# Patient Record
Sex: Female | Born: 1945 | Hispanic: No | Marital: Married | Smoking: Never smoker
Health system: Southern US, Community
[De-identification: ages and names within clinical notes are randomized; demographics above are authoritative.]

## PROBLEM LIST (undated history)

## (undated) DIAGNOSIS — I10 Essential (primary) hypertension: Secondary | ICD-10-CM

## (undated) DIAGNOSIS — E785 Hyperlipidemia, unspecified: Secondary | ICD-10-CM

## (undated) DIAGNOSIS — E119 Type 2 diabetes mellitus without complications: Secondary | ICD-10-CM

## (undated) DIAGNOSIS — I2510 Atherosclerotic heart disease of native coronary artery without angina pectoris: Secondary | ICD-10-CM

## (undated) HISTORY — DX: Hyperlipidemia, unspecified: E78.5

## (undated) HISTORY — DX: Type 2 diabetes mellitus without complications: E11.9

## (undated) HISTORY — PX: CORONARY ANGIOPLASTY: SHX604

## (undated) HISTORY — PX: KNEE SURGERY: SHX244

## (undated) HISTORY — DX: Atherosclerotic heart disease of native coronary artery without angina pectoris: I25.10

## (undated) HISTORY — PX: HEMORRHOID SURGERY: SHX153

---

## 1998-12-05 ENCOUNTER — Ambulatory Visit (HOSPITAL_BASED_OUTPATIENT_CLINIC_OR_DEPARTMENT_OTHER): Admission: RE | Admit: 1998-12-05 | Discharge: 1998-12-05 | Payer: Self-pay | Admitting: General Surgery

## 2008-10-29 ENCOUNTER — Emergency Department (HOSPITAL_COMMUNITY): Admission: EM | Admit: 2008-10-29 | Discharge: 2008-10-29 | Payer: Self-pay | Admitting: Family Medicine

## 2009-03-14 ENCOUNTER — Ambulatory Visit: Payer: Self-pay | Admitting: Internal Medicine

## 2009-03-14 DIAGNOSIS — I10 Essential (primary) hypertension: Secondary | ICD-10-CM | POA: Insufficient documentation

## 2009-03-14 DIAGNOSIS — E785 Hyperlipidemia, unspecified: Secondary | ICD-10-CM

## 2009-03-14 DIAGNOSIS — M5430 Sciatica, unspecified side: Secondary | ICD-10-CM | POA: Insufficient documentation

## 2009-03-24 LAB — CONVERTED CEMR LAB
CO2: 20 meq/L (ref 19–32)
Chloride: 107 meq/L (ref 96–112)
HDL: 47 mg/dL (ref >39)
LDL Cholesterol: 276 mg/dL — ABNORMAL HIGH (ref 0–99)
Sodium: 142 meq/L (ref 135–145)
Total CHOL/HDL Ratio: 7.6
VLDL: 32 mg/dL (ref 0–40)

## 2014-07-25 ENCOUNTER — Encounter: Payer: Self-pay | Admitting: Family Medicine

## 2017-04-23 DIAGNOSIS — E669 Obesity, unspecified: Secondary | ICD-10-CM | POA: Diagnosis not present

## 2017-04-23 DIAGNOSIS — I208 Other forms of angina pectoris: Secondary | ICD-10-CM | POA: Diagnosis not present

## 2017-04-23 DIAGNOSIS — I10 Essential (primary) hypertension: Secondary | ICD-10-CM | POA: Diagnosis not present

## 2017-04-23 DIAGNOSIS — I2510 Atherosclerotic heart disease of native coronary artery without angina pectoris: Secondary | ICD-10-CM | POA: Diagnosis not present

## 2017-10-29 DIAGNOSIS — I208 Other forms of angina pectoris: Secondary | ICD-10-CM | POA: Diagnosis not present

## 2017-10-29 DIAGNOSIS — I2510 Atherosclerotic heart disease of native coronary artery without angina pectoris: Secondary | ICD-10-CM | POA: Diagnosis not present

## 2017-10-29 DIAGNOSIS — E669 Obesity, unspecified: Secondary | ICD-10-CM | POA: Diagnosis not present

## 2017-10-29 DIAGNOSIS — I25119 Atherosclerotic heart disease of native coronary artery with unspecified angina pectoris: Secondary | ICD-10-CM | POA: Diagnosis not present

## 2017-10-29 DIAGNOSIS — I10 Essential (primary) hypertension: Secondary | ICD-10-CM | POA: Diagnosis not present

## 2018-03-15 ENCOUNTER — Encounter: Payer: Self-pay | Admitting: Family Medicine

## 2018-03-15 DIAGNOSIS — E785 Hyperlipidemia, unspecified: Secondary | ICD-10-CM | POA: Diagnosis not present

## 2018-05-06 DIAGNOSIS — I10 Essential (primary) hypertension: Secondary | ICD-10-CM | POA: Diagnosis not present

## 2018-05-06 DIAGNOSIS — I208 Other forms of angina pectoris: Secondary | ICD-10-CM | POA: Diagnosis not present

## 2018-05-06 DIAGNOSIS — I2510 Atherosclerotic heart disease of native coronary artery without angina pectoris: Secondary | ICD-10-CM | POA: Diagnosis not present

## 2018-11-21 ENCOUNTER — Ambulatory Visit (HOSPITAL_COMMUNITY)
Admission: EM | Admit: 2018-11-21 | Discharge: 2018-11-21 | Disposition: A | Discharge status: Home / Self Care | Payer: Medicare Other | Attending: Family Medicine | Admitting: Family Medicine

## 2018-11-21 ENCOUNTER — Encounter: Payer: Self-pay | Admitting: Emergency Medicine

## 2018-11-21 DIAGNOSIS — I10 Essential (primary) hypertension: Secondary | ICD-10-CM

## 2018-11-21 DIAGNOSIS — Z760 Encounter for issue of repeat prescription: Secondary | ICD-10-CM | POA: Insufficient documentation

## 2018-11-21 HISTORY — DX: Essential (primary) hypertension: I10

## 2018-11-21 MED ORDER — AMLODIPINE BESYLATE 10 MG PO TABS: 10.0000 mg | ORAL_TABLET | Freq: Every day | ORAL | 0 refills | Status: DC

## 2018-11-21 MED ORDER — ISOSORBIDE MONONITRATE ER 60 MG PO TB24: 60.0000 mg | ORAL_TABLET | Freq: Every day | ORAL | 1 refills | Status: DC

## 2018-11-21 MED ORDER — METOPROLOL TARTRATE 50 MG PO TABS: 50.0000 mg | ORAL_TABLET | Freq: Two times a day (BID) | ORAL | 1 refills | Status: DC

## 2018-11-21 MED ORDER — METFORMIN HCL 500 MG PO TABS: 500.0000 mg | ORAL_TABLET | Freq: Every day | ORAL | 1 refills | Status: DC

## 2018-11-21 MED ORDER — CLOPIDOGREL BISULFATE 75 MG PO TABS: 75.0000 mg | ORAL_TABLET | Freq: Every day | ORAL | 1 refills | Status: DC

## 2018-11-21 MED ORDER — METOPROLOL TARTRATE 100 MG PO TABS: 100.0000 mg | ORAL_TABLET | Freq: Two times a day (BID) | ORAL | 1 refills | Status: DC

## 2018-11-21 MED ORDER — LISINOPRIL 40 MG PO TABS: 40.0000 mg | ORAL_TABLET | Freq: Every day | ORAL | 1 refills | Status: DC

## 2018-11-21 MED ORDER — ATORVASTATIN CALCIUM 80 MG PO TABS: 80.0000 mg | ORAL_TABLET | Freq: Every day | ORAL | 1 refills | Status: DC

## 2018-11-21 NOTE — ED Provider Notes (Signed)
[facility]    CSN: [id number] Arrival date & time: 11/21/18  1055     History   Chief Complaint Chief Complaint  Patient presents with  . Medication Refill    HPI Erin Estes is a 72 y.o. female.   HPI  Patient recently moved here from [facility].  She states she is out of all of her medications.  See medication list, all of them are refilled today.  She is compliant with medications.  She feels better when taking the medicines.  She needs a referral to her PCP.  She states she is been trying to find one but has not had any luck.  She has well-controlled hypertension.  Well-controlled diabetes.  History of heart disease.  Today she states she is feeling well.  Past Medical History:  Diagnosis Date  . Hypertension     Patient Active Problem List   Diagnosis Date Noted  . HYPERLIPIDEMIA 03/14/2009  . HYPERTENSION 03/14/2009  . 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 No pertinent surgical history.  OB History   No obstetric history on file.      Home Medications    Prior to Admission medications   Medication Sig Start Date End Date Taking? Authorizing Provider  amLODipine (NORVASC) 10 MG tablet Take 1 tablet (10 mg total) by mouth daily. 11/21/18   Eustace Moore, MD  atorvastatin (LIPITOR) 80 MG tablet Take 1 tablet (80 mg total) by mouth daily. 11/21/18   Eustace Moore, MD  clopidogrel (PLAVIX) 75 MG tablet Take 1 tablet (75 mg total) by mouth daily. 11/21/18   Eustace Moore, MD  isosorbide mononitrate (IMDUR) 60 MG 24 hr tablet Take 1 tablet (60 mg total) by mouth daily. 11/21/18   Eustace Moore, MD  lisinopril (PRINIVIL,ZESTRIL) 40 MG tablet Take 1 tablet (40 mg total) by mouth daily. 11/21/18   Eustace Moore, MD  metFORMIN (GLUCOPHAGE) 500 MG tablet Take 1 tablet (500 mg total) by mouth daily with breakfast. 11/21/18   Eustace Moore, MD  metoprolol tartrate (LOPRESSOR) 100 MG tablet Take 1 tablet (100 mg  total) by mouth 2 (two) times daily. 11/21/18   Eustace Moore, MD  metoprolol tartrate (LOPRESSOR) 50 MG tablet Take 1 tablet (50 mg total) by mouth 2 (two) times daily. In addition to the 100 mg tablet 11/21/18   Eustace Moore, MD    Family History Family History  Family history unknown: Yes    Social History Social History   Tobacco Use  . Smoking status: Never Smoker  . Smokeless tobacco: Never Used  Substance Use Topics  . Alcohol use: Not on file  . Drug use: Not on file     Allergies   Patient has no known allergies.   Review of Systems Review of Systems  Constitutional: Negative for chills and fever.  HENT: Negative for ear pain and sore throat.   Eyes: Negative for pain and visual disturbance.  Respiratory: Negative for cough and shortness of breath.   Cardiovascular: Negative for chest pain and palpitations.  Gastrointestinal: Negative for abdominal pain and vomiting.  Genitourinary: Negative for dysuria and hematuria.  Musculoskeletal: Negative for arthralgias and back pain.  Skin: Negative for color change and rash.  Neurological: Negative for seizures and syncope.  All other systems reviewed and are negative.    Physical Exam Triage Vital Signs ED Triage Vitals  Enc Vitals Group     BP 11/21/18 1306 (!) 141/71  Pulse Rate 11/21/18 1306 74     Resp 11/21/18 1306 16     Temp 11/21/18 1306 (!) 97.3 F (36.3 C)     Temp Source 11/21/18 1306 Oral     SpO2 11/21/18 1306 97 %     Weight --      Height --      Head Circumference --      Peak Flow --      Pain Score 11/21/18 1307 0     Pain Loc --      Pain Edu? --      Excl. in GC? --    No data found.  Updated Vital Signs BP (!) 141/71 (BP Location: Right Arm)   Pulse 74   Temp (!) 97.3 F (36.3 C) (Oral)   Resp 16   SpO2 97%   Visual Acuity Right Eye Distance:   Left Eye Distance:   Bilateral Distance:    Right Eye Near:   Left Eye Near:    Bilateral Near:     Physical  Exam Constitutional:      General: She is not in acute distress.    Appearance: She is well-developed.  HENT:     Head: Normocephalic and atraumatic.  Eyes:     Conjunctiva/sclera: Conjunctivae normal.     Pupils: Pupils are equal, round, and reactive to light.  Neck:     Musculoskeletal: Normal range of motion.  Cardiovascular:     Rate and Rhythm: Normal rate.  Pulmonary:     Effort: Pulmonary effort is normal. No respiratory distress.  Abdominal:     General: There is no distension.     Palpations: Abdomen is soft.  Musculoskeletal: Normal range of motion.  Skin:    General: Skin is warm and dry.  Neurological:     Mental Status: She is alert.      UC Treatments / Results  Labs (all labs ordered are listed, but only abnormal results are displayed) Labs Reviewed - No data to display  EKG None  Radiology No results found.  Procedures Procedures (including critical care time)  Medications Ordered in UC Medications - No data to display  Initial Impression / Assessment and Plan / UC Course  I have reviewed the triage vital signs and the nursing notes.  Pertinent labs & imaging results that were available during my care of the patient were reviewed by me and considered in my medical decision making (see chart for details).     Seen with Spanish interpreter.  Medicines refilled.  Referral to PCP, placed in Final Clinical Impressions(s) / UC Diagnoses   Final diagnoses:  Essential hypertension  Medication refill     Discharge Instructions     All of your prescriptions have been refilled Call today to set up an appointment with a PCP   ED Prescriptions    Medication Sig Dispense Auth. Provider   metoprolol tartrate (LOPRESSOR) 100 MG tablet Take 1 tablet (100 mg total) by mouth 2 (two) times daily. 60 tablet Eustace MooreNelson, Esly Selvage Sue, MD   isosorbide mononitrate (IMDUR) 60 MG 24 hr tablet Take 1 tablet (60 mg total) by mouth daily. 30 tablet Eustace MooreNelson, Taneia Mealor Sue,  MD   amLODipine (NORVASC) 10 MG tablet Take 1 tablet (10 mg total) by mouth daily. 30 tablet Eustace MooreNelson, Nyshaun Standage Sue, MD   atorvastatin (LIPITOR) 80 MG tablet Take 1 tablet (80 mg total) by mouth daily. 30 tablet Eustace MooreNelson, Shamon Cothran Sue, MD   metFORMIN (GLUCOPHAGE) 500 MG tablet  Take 1 tablet (500 mg total) by mouth daily with breakfast. 30 tablet Eustace MooreNelson, Taisley Mordan Sue, MD   clopidogrel (PLAVIX) 75 MG tablet Take 1 tablet (75 mg total) by mouth daily. 30 tablet Eustace MooreNelson, Layan Zalenski Sue, MD   lisinopril (PRINIVIL,ZESTRIL) 40 MG tablet Take 1 tablet (40 mg total) by mouth daily. 30 tablet Eustace MooreNelson, Malvina Schadler Sue, MD   metoprolol tartrate (LOPRESSOR) 50 MG tablet Take 1 tablet (50 mg total) by mouth 2 (two) times daily. In addition to the 100 mg tablet 60 tablet Eustace MooreNelson, Yama Nielson Sue, MD     Controlled Substance Prescriptions [place] Controlled Substance Registry consulted? Not Applicable   Eustace MooreNelson, Tmya Wigington Sue, MD 11/21/18 [phone]

## 2018-11-21 NOTE — Discharge Instructions (Signed)
All of your prescriptions have been refilled Call today to set up an appointment with a PCP

## 2018-11-21 NOTE — ED Triage Notes (Addendum)
Pt needs refill on medication: Metoprolol Tartrate, metformin, Clopidogrel ,Amlodipine Besylate, isosrbide , lisinopril, atorvastatin.  Pt brought in her medication bottles.

## 2018-12-09 ENCOUNTER — Ambulatory Visit (INDEPENDENT_AMBULATORY_CARE_PROVIDER_SITE_OTHER): Payer: Managed Care, Other (non HMO) | Admitting: Family Medicine

## 2018-12-09 ENCOUNTER — Encounter: Payer: Self-pay | Admitting: Family Medicine

## 2018-12-09 VITALS — BP 165/83 | HR 63 | Temp 98.4°F | Resp 17 | Ht 64.0 in | Wt 177.8 lb

## 2018-12-09 DIAGNOSIS — M7918 Myalgia, other site: Secondary | ICD-10-CM | POA: Diagnosis not present

## 2018-12-09 DIAGNOSIS — J101 Influenza due to other identified influenza virus with other respiratory manifestations: Secondary | ICD-10-CM | POA: Diagnosis not present

## 2018-12-09 DIAGNOSIS — R05 Cough: Secondary | ICD-10-CM

## 2018-12-09 DIAGNOSIS — E1159 Type 2 diabetes mellitus with other circulatory complications: Secondary | ICD-10-CM

## 2018-12-09 DIAGNOSIS — E782 Mixed hyperlipidemia: Secondary | ICD-10-CM

## 2018-12-09 DIAGNOSIS — Z7689 Persons encountering health services in other specified circumstances: Secondary | ICD-10-CM

## 2018-12-09 DIAGNOSIS — R5383 Other fatigue: Secondary | ICD-10-CM | POA: Diagnosis not present

## 2018-12-09 DIAGNOSIS — I2510 Atherosclerotic heart disease of native coronary artery without angina pectoris: Secondary | ICD-10-CM

## 2018-12-09 DIAGNOSIS — Z1329 Encounter for screening for other suspected endocrine disorder: Secondary | ICD-10-CM

## 2018-12-09 DIAGNOSIS — R6889 Other general symptoms and signs: Secondary | ICD-10-CM

## 2018-12-09 DIAGNOSIS — I10 Essential (primary) hypertension: Secondary | ICD-10-CM

## 2018-12-09 LAB — GLUCOSE, POCT (MANUAL RESULT ENTRY): POC Glucose: 105 mg/dl — AB (ref 70–99)

## 2018-12-09 LAB — POCT URINALYSIS DIP (CLINITEK)
Bilirubin, UA: NEGATIVE
GLUCOSE UA: NEGATIVE mg/dL
Ketones, POC UA: NEGATIVE mg/dL
Leukocytes, UA: NEGATIVE
Nitrite, UA: NEGATIVE
POC PROTEIN,UA: 30 — AB
Spec Grav, UA: 1.025 (ref 1.010–1.025)
Urobilinogen, UA: 0.2 E.U./dL
pH, UA: 5.5 (ref 5.0–8.0)

## 2018-12-09 LAB — POCT INFLUENZA A/B
INFLUENZA A, POC: NEGATIVE
Influenza B, POC: POSITIVE — AB

## 2018-12-09 MED ORDER — ISOSORBIDE MONONITRATE ER 60 MG PO TB24: 60.0000 mg | ORAL_TABLET | Freq: Every day | ORAL | 1 refills | Status: DC

## 2018-12-09 MED ORDER — METFORMIN HCL 500 MG PO TABS: 500.0000 mg | ORAL_TABLET | Freq: Two times a day (BID) | ORAL | 1 refills | Status: DC

## 2018-12-09 MED ORDER — BENZONATATE 100 MG PO CAPS: 100.0000 mg | ORAL_CAPSULE | Freq: Three times a day (TID) | ORAL | 0 refills | Status: DC | PRN

## 2018-12-09 MED ORDER — CLOPIDOGREL BISULFATE 75 MG PO TABS: 75.0000 mg | ORAL_TABLET | Freq: Every day | ORAL | 1 refills | Status: DC

## 2018-12-09 MED ORDER — OSELTAMIVIR PHOSPHATE 75 MG PO CAPS: 75.0000 mg | ORAL_CAPSULE | Freq: Two times a day (BID) | ORAL | 0 refills | Status: DC

## 2018-12-09 MED ORDER — METOPROLOL TARTRATE 100 MG PO TABS: 100.0000 mg | ORAL_TABLET | Freq: Two times a day (BID) | ORAL | 1 refills | Status: DC

## 2018-12-09 MED ORDER — ATORVASTATIN CALCIUM 80 MG PO TABS: 80.0000 mg | ORAL_TABLET | Freq: Every day | ORAL | 1 refills | Status: DC

## 2018-12-09 MED ORDER — METOPROLOL TARTRATE 25 MG PO TABS: 25.0000 mg | ORAL_TABLET | Freq: Two times a day (BID) | ORAL | 1 refills | Status: DC

## 2018-12-09 MED ORDER — ALBUTEROL SULFATE HFA 108 (90 BASE) MCG/ACT IN AERS: 2.0000 | INHALATION_SPRAY | RESPIRATORY_TRACT | 1 refills | Status: DC | PRN

## 2018-12-09 MED ORDER — LISINOPRIL 40 MG PO TABS: 40.0000 mg | ORAL_TABLET | Freq: Every day | ORAL | 1 refills | Status: DC

## 2018-12-09 NOTE — Progress Notes (Signed)
Erin Estes, is a 73 y.o. female  ZOX:[id number]  WJX:[id number]  DOB - 15-Sep-1946  CC:  Chief Complaint  Patient presents with  . Establish Care       HPI: Erin Estes is a 73 y.o. female is here today to establish care and medications refills.   Tyyne Hounshell medical history significant for coronary artery disease, hyperlipidemia, obesity, and hypertension  Patient presents today for chronic condition management.  She has no previous medical records on her current medications. She can only provide limited medical information she recently moved from [place].  She is originally from [place] and relocated to [place] in 2012 upon entering the [place]. Her history is significant for coronary artery disease with stent placement initial stent placed in 2011 in [place] and subsequent stent placed in 2012 in [place]. She reports she did not sustain a heart attack however was having angina type symptoms when she presented for evaluation was found to have blockages. She has not seen cardiology in some time. She is complaining of chest tightness however not chest pain and feels this is related to a cough and body aches she has had for the last 48 hours.  She also suffers from diabetes and does not routinely check her blood sugar.  Her current regimen consist of metformin 500 mg twice daily only.   Flu-like Symptoms  Concern for flu.  Reports developing a persistent cough, chest tightness, body aches x48 hours ago. She is uncertain of fever although feels profoundly fatigue with some shortness of breath.  No recent influenza vaccine.  Unknown if sick exposures.  No known history of pneumonia. She afebrile at present.   Patient denies new headaches, chest pain, abdominal pain, nausea, new weakness , numbness or tingling, SOB, edema, or worrisome cough. .    Current medications: Current Outpatient Medications:  .  atorvastatin (LIPITOR) 80 MG tablet, Take 80 mg by mouth daily., Disp: , Rfl:  .   clopidogrel (PLAVIX) 75 MG tablet, Take 75 mg by mouth daily., Disp: , Rfl:  .  isosorbide mononitrate (IMDUR) 60 MG 24 hr tablet, Take 60 mg by mouth daily., Disp: , Rfl:  .  lisinopril (PRINIVIL,ZESTRIL) 40 MG tablet, Take 40 mg by mouth daily., Disp: , Rfl:  .  metFORMIN (GLUCOPHAGE) 500 MG tablet, Take 500 mg by mouth 2 (two) times daily with a meal., Disp: , Rfl:  .  metoprolol tartrate (LOPRESSOR) 100 MG tablet, Take 100 mg by mouth daily., Disp: , Rfl:  .  metoprolol tartrate (LOPRESSOR) 25 MG tablet, Take 25 mg by mouth 2 (two) times daily., Disp: , Rfl:    Pertinent family medical history: family history includes Hypertension in her sister and sister.   No Known Allergies  Social History   Socioeconomic History  . Marital status: Married    Spouse name: Not on file  . Number of children: Not on file  . Years of education: Not on file  . Highest education level: Not on file  Occupational History  . Not on file  Social Needs  . Financial resource strain: Not on file  . Food insecurity:    Worry: Not on file    Inability: Not on file  . Transportation needs:    Medical: Not on file    Non-medical: Not on file  Tobacco Use  . Smoking status: Never Smoker  . Smokeless tobacco: Never Used  Substance and Sexual Activity  . Alcohol use: Not Currently    Frequency: Never  . Drug  use: Never  . Sexual activity: Not on file  Lifestyle  . Physical activity:    Days per week: Not on file    Minutes per session: Not on file  . Stress: Not on file  Relationships  . Social connections:    Talks on phone: Not on file    Gets together: Not on file    Attends religious service: Not on file    Active member of club or organization: Not on file    Attends meetings of clubs or organizations: Not on file    Relationship status: Not on file  . Intimate partner violence:    Fear of current or ex partner: Not on file    Emotionally abused: Not on file    Physically abused: Not on  file    Forced sexual activity: Not on file  Other Topics Concern  . Not on file  Social History Narrative  . Not on file    Review of Systems: Pertinent negatives listed in HPI  Objective:   Vitals:   12/09/18 0930  BP: (!) 165/83  Pulse: 63  Resp: 17  Temp: 98.4 F (36.9 C)  SpO2: 96%    BP Readings from Last 3 Encounters:  12/09/18 (!) 165/83    Filed Weights   12/09/18 0930  Weight: 177 lb 12.8 oz (80.6 kg)      Physical Exam: Constitutional: Patient appears well-developed and well-nourished. No distress. HENT: Normocephalic, atraumatic, External right and left ear normal. Oropharynx is erythematous, bilateral nares congestion with thick purulent mucus present. Eyes: Conjunctivae and EOM are normal. PERRLA, no scleral icterus. Neck: Normal ROM. Neck supple. No JVD. No tracheal deviation. No thyromegaly. CVS: RRR, S1/S2 +, no murmurs, no gallops, no carotid bruit.  Pulmonary: Increased effort rhonchorous breath sounds noted  Musculoskeletal: Normal range of motion. No edema and no tenderness.  Neuro: Alert. Normal muscle tone coordination. Normal gait.  Skin: Skin is warm and dry. No rash noted. Not diaphoretic. No erythema. No pallor. Psychiatric: Normal mood and affect. Behavior, judgment, thought content normal.      Assessment and plan:  1. Encounter to establish care 2. CAD, multiple vessel, 2 stent  -Will need referral to cardiology for routine surveillance of coronary artery disease given patient's history of angioplasty. Referral placed to cardiology  3. Mixed hyperlipidemia - Lipid Panel  4. Type 2 diabetes mellitus with other circulatory complication, without long-term current use of insulin (HCC) Refilling current meds at current dose.  Patient has no medical records available to give history of diabetes control.    We will check the following labs today make adjustments as warranted. - Glucose (CBG) - POCT URINALYSIS DIP (CLINITEK) -  Microalbumin/Creatinine Ratio, Urine - Comprehensive metabolic panel  5. Flu-like symptoms - CBC with Differential - Influenza A/B, positive B Start Tamiflu 75 mg twice daily x10 days   6. Thyroid disorder screen - Thyroid Panel With TSH  7. Hypertension, essential, uncontrolled  Patient has not taken any medication prior to today's visit and is acutely ill with the flu.  We will not make any adjustments in medication today patient will return in 1 week for blood pressure check during her follow-up visit.  Return in about 6 days (around 12/15/2018) for Flu and chronic conditions .   A total of 40  minutes spent, greater than 50 % of this time was spent use of spanish interpreter, obtaining history without medical records, reconciling medications, performing diagnostic test, counseling, and coordination of  care.  The patient was given clear instructions to go to ER or return to medical center if symptoms don't improve, worsen or new problems develop. The patient verbalized understanding. The patient was advised  to call and obtain lab results if they haven't heard anything from out office within 7-10 business days.  Joaquin Courts, FNP [facility] at [facility] [address] [place] [address] [phone]: [phone]    This note has been created with Dragon speech recognition software and Paediatric nurse. Any transcriptional errors are unintentional.

## 2018-12-09 NOTE — Patient Instructions (Addendum)
[facility]    Erin Estes was seen by Erin CourtsKimberly Harris, FNP today.   Erin MarlinHelda Estes primary care provider is Erin NeighborsHarris, Erin S, FNP.   For the best care possible, you should try to see Erin CourtsKimberly Harris, FNP-C whenever you come to the clinic.   We look forward to seeing you again soon!  If you have any questions about your visit today, please call us at [phone] or feel free to reach your primary care provider via MyChart.      You have tested positive for influenza B.  You are contagious to others therefore avoid direct close contact with family members.  Start you on Tamiflu as directed.  I recommend bed rest for the next several days until you return to activity returns to normal.  He will follow-up here in office next week however if symptoms worsen or do not improve go immediately to the emergency department for further evaluation.   Review medication list attached and take all medications as prescribed today.   Gripe en los adultos Influenza, Adult A la gripe tambin se la conoce como "influenza". Es una [employer] infeccin en los pulmones, la nariz y la garganta (vas respiratorias). La causa un virus. La gripe provoca sntomas que son similares a los de un resfro. Tambin causa fiebre alta y dolores corporales. Se transmite fcilmente de persona a persona (es contagiosa). La mejor manera de prevenir la gripe es aplicndose la vacuna contra la gripe todos los aos. Cules son las causas? La causa de esta afeccin es el virus de la influenza. Puede contraer el virus de las siguientes maneras:  Respirar las gotitas que estn en el aire y que provienen de la tos o el estornudo de una persona que tiene el virus.  Tocar algo que tiene el virus (est contaminado) y luego tocarse la boca, la nariz o los ojos. Qu incrementa el riesgo? Hay ciertas cosas que lo pueden hacer ms propenso a Warden/rangertener gripe. Estas incluyen lo  siguiente:  No lavarse las manos con frecuencia.  Tener contacto cercano con [place] personas durante la temporada de resfro y gripe.  Tocarse la boca, los ojos o la nariz sin antes lavarse las manos.  No recibir la Teachers Insurance and Annuity Associationvacuna antigripal todos los aos. Puede correr un mayor riesgo de tener gripe, junto con problemas graves como una infeccin pulmonar (neumona), si:  Es mayor de 65 aos de edad.  Est embarazada.  Tiene debilitado el sistema que combate las defensas (sistema inmunitario) debido a una enfermedad o porque toma determinados medicamentos.  Tiene una enfermedad prolongada (crnica), por ejemplo: ? Enfermedad cardaca, renal o pulmonar. ? Diabetes. ? Asma.  Tiene un trastorno heptico.  Tiene mucho sobrepeso (obesidad Barbadosmrbida).  Tiene anemia. Esta es una afeccin que afecta a los glbulos rojos. Cules son los signos o los sntomas? Los sntomas normalmente comienzan de repente y Armando Reichertduran entre 4 y [address] das. Pueden incluir los siguientes:  Grant RutsFiebre y escalofros.  Dolores de Renocabeza, dolores en el cuerpo o dolores musculares.  Dolor de Advertising copywritergarganta.  Tos.  Secrecin o congestin nasal.  DentistMalestar en el pecho.  No desear comer en las cantidades normales (prdida del apetito).  Debilidad o cansancio (fatiga).  Mareos.  Malestar estomacal (nuseas) o ganas de devolver (vmitos). Cmo se trata? Si la gripe se encuentra de forma temprana, se la puede tratar con medicamentos que pueden ayudar a reducir la gravedad de la enfermedad y reducir su duracin (  medicamentos antivirales). Estos pueden administrarse por boca (va oral) o por va (catter) intravenosa. Cuidarse en su hogar puede ayudar a que mejoren los sntomas. El mdico puede sugerirle lo siguiente:  Tomar medicamentos de Sales promotion account executive.  Beber mucho lquido. La gripe suele desaparecer sola. Si tiene sntomas muy graves u otros problemas, puede recibir tratamiento en un hospital. Siga estas indicaciones en su  casa:     Actividad  Descanse todo lo que sea necesario. Duerma lo suficiente.  Lanny Hurst en su casa y no concurra al Aleen Campi o a la escuela, como se lo haya indicado el mdico. ? No salga de su casa hasta que no haya tenido fiebre por 24horas sin tomar medicamentos. ? Salga de su casa solo para ir al [employer]. Comida y bebida  Beverely Risen SRO (solucin de rehidratacin oral). Es Neomia Dear bebida que se vende en farmacias y tiendas.  Beba suficiente lquido para Radio producer pis (la orina) de color amarillo plido.  En la medida en que pueda, beba lquidos claros en pequeas cantidades. Los lquidos transparentes son, por ejemplo: ? Westley Hummer. ? Trocitos de hielo. ? Jugo de frutas con agua agregada (jugo de frutas diluido). ? Bebidas deportivas de bajas caloras.  En la medida en que pueda, consuma alimentos blandos y fciles de digerir en pequeas cantidades. Estos alimentos incluyen: ? Bananas. ? Pur de [employer]. ? Arroz. ? BJ's. ? Tostadas. ? Galletas.  No coma ni beba lo siguiente: ? Lquidos con alto contenido de azcar o cafena. ? Alcohol. ? Alimentos condimentados o con alto contenido de [place] Indicaciones generales  Baxter International de venta libre y los recetados solamente como se lo haya indicado el mdico.  Use un humidificador de aire fro para que el aire de su casa est ms hmedo. Esto puede facilitar la respiracin.  Al toser o estornudar, cbrase la boca y la [place].  Lvese las manos con agua y jabn frecuentemente, en especial despus de toser o Engineering geologist. Use desinfectante para manos con alcohol si no dispone de [place] y [place].  Concurra a todas las visitas de control como se lo haya indicado el mdico. Esto es importante. Cmo se evita?   Colquese la vacuna antigripal todos los [place]. Puede colocarse la vacuna contra la gripe a fines de verano, en otoo o en invierno. Pregntele al mdico cundo debe aplicarse la vacuna contra la gripe.  Evite el  contacto con personas que estn enfermas durante el otoo y el invierno (la temporada de resfro y gripe). Comunquese con un mdico si:  Tiene sntomas nuevos.  Tiene los siguientes sntomas: ? Journalist, newspaper. ? Materia fecal lquida (diarrea). ? Fiebre.  La tos empeora.  Empieza a tener ms mucosidad.  Tiene Programme researcher, broadcasting/film/video.  Vomita. Solicite ayuda inmediatamente si:  Le falta el aire.  Tiene dificultad para respirar.  La piel o las uas se ponen de un color azulado.  Presenta dolor muy intenso o rigidez en el cuello.  Tiene dolor de cabeza repentino.  Le duele la cara o el odo de forma repentina.  No puede comer ni beber sin vomitar. Resumen  La gripe es una infeccin en los pulmones, la nariz y Administrator. La causa un virus.  Tome los medicamentos de venta libre y los recetados solamente como se lo haya indicado el mdico.  Aplicarse la vacuna contra la gripe todos los aos es la mejor manera de evitar contagiarse la gripe. Esta informacin no tiene Theme park manager el consejo del mdico.  Asegrese de hacerle al mdico cualquier pregunta que tenga. Document Released: 02/05/2009 Document Revised: 06/22/2018 Document Reviewed: 06/22/2018 Elsevier Interactive Patient Education  2019 [employer].

## 2018-12-10 LAB — CBC WITH DIFFERENTIAL/PLATELET
BASOS ABS: 0 10*3/uL (ref 0.0–0.2)
Basos: 0 %
EOS (ABSOLUTE): 0 10*3/uL (ref 0.0–0.4)
Eos: 0 %
HEMATOCRIT: 35.4 % (ref 34.0–46.6)
Hemoglobin: 11.1 g/dL (ref 11.1–15.9)
Immature Grans (Abs): 0 10*3/uL (ref 0.0–0.1)
Immature Granulocytes: 0 %
LYMPHS ABS: 2 10*3/uL (ref 0.7–3.1)
Lymphs: 22 %
MCH: 21.6 pg — ABNORMAL LOW (ref 26.6–33.0)
MCHC: 31.4 g/dL — ABNORMAL LOW (ref 31.5–35.7)
MCV: 69 fL — AB (ref 79–97)
Monocytes Absolute: 1 10*3/uL — ABNORMAL HIGH (ref 0.1–0.9)
Monocytes: 11 %
Neutrophils Absolute: 5.9 10*3/uL (ref 1.4–7.0)
Neutrophils: 67 %
Platelets: 257 10*3/uL (ref 150–450)
RBC: 5.13 x10E6/uL (ref 3.77–5.28)
RDW: 15.4 % (ref 11.7–15.4)
WBC: 8.9 10*3/uL (ref 3.4–10.8)

## 2018-12-10 LAB — COMPREHENSIVE METABOLIC PANEL
ALK PHOS: 89 IU/L (ref 39–117)
ALT: 48 IU/L — ABNORMAL HIGH (ref 0–32)
AST: 35 IU/L (ref 0–40)
Albumin/Globulin Ratio: 1.4 (ref 1.2–2.2)
Albumin: 4.4 g/dL (ref 3.5–4.8)
BUN/Creatinine Ratio: 17 (ref 12–28)
BUN: 13 mg/dL (ref 8–27)
Bilirubin Total: <0.2 mg/dL (ref 0.0–1.2)
CO2: 24 mmol/L (ref 20–29)
Calcium: 9.1 mg/dL (ref 8.7–10.3)
Chloride: 103 mmol/L (ref 96–106)
Creatinine, Ser: 0.76 mg/dL (ref 0.57–1.00)
GFR calc Af Amer: 92 mL/min/{1.73_m2} (ref >59)
GFR calc non Af Amer: 80 mL/min/{1.73_m2} (ref >59)
Globulin, Total: 3.1 g/dL (ref 1.5–4.5)
Glucose: 104 mg/dL — ABNORMAL HIGH (ref 65–99)
Potassium: 4.3 mmol/L (ref 3.5–5.2)
SODIUM: 143 mmol/L (ref 134–144)
Total Protein: 7.5 g/dL (ref 6.0–8.5)

## 2018-12-10 LAB — LIPID PANEL
CHOL/HDL RATIO: 3.6 ratio (ref 0.0–4.4)
Cholesterol, Total: 145 mg/dL (ref 100–199)
HDL: 40 mg/dL (ref >39)
LDL Calculated: 85 mg/dL (ref 0–99)
Triglycerides: 99 mg/dL (ref 0–149)
VLDL Cholesterol Cal: 20 mg/dL (ref 5–40)

## 2018-12-10 LAB — MICROALBUMIN / CREATININE URINE RATIO
CREATININE, UR: 212.9 mg/dL
Microalb/Creat Ratio: 12.6 mg/g creat (ref 0.0–30.0)
Microalbumin, Urine: 26.8 ug/mL

## 2018-12-10 LAB — HEMOGLOBIN A1C
Est. average glucose Bld gHb Est-mCnc: 157 mg/dL
Hgb A1c MFr Bld: 7.1 % — ABNORMAL HIGH (ref 4.8–5.6)

## 2018-12-10 LAB — THYROID PANEL WITH TSH
Free Thyroxine Index: 1.8 (ref 1.2–4.9)
T3 Uptake Ratio: 23 % — ABNORMAL LOW (ref 24–39)
T4, Total: 7.7 ug/dL (ref 4.5–12.0)
TSH: 3.37 u[IU]/mL (ref 0.450–4.500)

## 2018-12-13 DIAGNOSIS — Z7689 Persons encountering health services in other specified circumstances: Secondary | ICD-10-CM | POA: Insufficient documentation

## 2018-12-13 DIAGNOSIS — E782 Mixed hyperlipidemia: Secondary | ICD-10-CM | POA: Insufficient documentation

## 2018-12-13 DIAGNOSIS — E119 Type 2 diabetes mellitus without complications: Secondary | ICD-10-CM | POA: Insufficient documentation

## 2018-12-13 DIAGNOSIS — I2510 Atherosclerotic heart disease of native coronary artery without angina pectoris: Secondary | ICD-10-CM | POA: Insufficient documentation

## 2018-12-13 DIAGNOSIS — I10 Essential (primary) hypertension: Secondary | ICD-10-CM | POA: Insufficient documentation

## 2018-12-14 ENCOUNTER — Ambulatory Visit: Payer: Managed Care, Other (non HMO)

## 2018-12-14 ENCOUNTER — Ambulatory Visit (INDEPENDENT_AMBULATORY_CARE_PROVIDER_SITE_OTHER): Payer: Medicare Other | Admitting: Family Medicine

## 2018-12-14 ENCOUNTER — Ambulatory Visit (INDEPENDENT_AMBULATORY_CARE_PROVIDER_SITE_OTHER): Payer: Medicare Other

## 2018-12-14 ENCOUNTER — Encounter: Payer: Self-pay | Admitting: Family Medicine

## 2018-12-14 VITALS — BP 157/81 | HR 83 | Resp 18 | Ht 64.0 in | Wt 178.2 lb

## 2018-12-14 DIAGNOSIS — R062 Wheezing: Secondary | ICD-10-CM | POA: Diagnosis not present

## 2018-12-14 DIAGNOSIS — J101 Influenza due to other identified influenza virus with other respiratory manifestations: Secondary | ICD-10-CM

## 2018-12-14 DIAGNOSIS — R0602 Shortness of breath: Secondary | ICD-10-CM

## 2018-12-14 DIAGNOSIS — I10 Essential (primary) hypertension: Secondary | ICD-10-CM | POA: Diagnosis not present

## 2018-12-14 DIAGNOSIS — R05 Cough: Secondary | ICD-10-CM

## 2018-12-14 DIAGNOSIS — E1159 Type 2 diabetes mellitus with other circulatory complications: Secondary | ICD-10-CM | POA: Diagnosis not present

## 2018-12-14 DIAGNOSIS — E782 Mixed hyperlipidemia: Secondary | ICD-10-CM | POA: Diagnosis not present

## 2018-12-14 LAB — GLUCOSE, POCT (MANUAL RESULT ENTRY): POC GLUCOSE: 203 mg/dL — AB (ref 70–99)

## 2018-12-14 MED ORDER — METFORMIN HCL 500 MG PO TABS: ORAL_TABLET | ORAL | 1 refills | Status: DC

## 2018-12-14 NOTE — Progress Notes (Signed)
Established Patient Office Visit  Subjective:  Patient ID: Erin SamplesHelda Estes, female    DOB: 12/05/1945  Age: 73 y.o. MRN: [id number]  CC:  Chief Complaint  Patient presents with  . Influenza    still having cough, SHOB & some wheezing. completed tamiflu  . Results    discuss recent labs   HPI Erin Estes presents for follow-up after recent diagnosis of influenza B.  Patient was seen in office 12/09/2018 to establish care medication refills. That visit patient was observed to have a persistent cough and appeared ill. A nasal specimen was collected and resulted positive for influenza B. She was started on Tamiflu and returns today for follow-up. During that visit she also had routine screening labs performed as she has limited medical history available. She suffers from Diabetes. Most recent A1C 7.1. All other labs were within expected range. She has completed her TamiFlu, continues to experience SOB, wheezing, and persistent cough. No prior history of pneumonia. Endorses fatigue and weakness. No known fever. Cough is continues, non-productive, slightly improved from prior visit. Denies chest tightness, nausea, vomiting, or headache. Past Medical History:  Diagnosis Date  . CAD (coronary artery disease)   . Diabetes mellitus without complication (HCC)   . Hyperlipidemia   . Hypertension     Past Surgical History:  Procedure Laterality Date  . CORONARY ANGIOPLASTY N/A   . HEMORRHOID SURGERY    . KNEE SURGERY Left       Family History  Problem Relation Age of Onset  . Hypertension Sister   . Hypertension Sister   . Stroke Neg Hx     Social History   Socioeconomic History  . Marital status: Married    Spouse name: Not on file  . Number of children: Not on file  . Years of education: Not on file  . Highest education level: Not on file  Occupational History  . Not on file  Social Needs  . Financial resource strain: Not on file  . Food insecurity:    Worry: Not on file   Inability: Not on file  . Transportation needs:    Medical: Not on file    Non-medical: Not on file  Tobacco Use  . Smoking status: Never Smoker  . Smokeless tobacco: Never Used  Substance and Sexual Activity  . Alcohol use: Not Currently    Frequency: Never  . Drug use: Never  . Sexual activity: Not on file  Lifestyle  . Physical activity:    Days per week: Not on file    Minutes per session: Not on file  . Stress: Not on file  Relationships  . Social connections:    Talks on phone: Not on file    Gets together: Not on file    Attends religious service: Not on file    Active member of club or organization: Not on file    Attends meetings of clubs or organizations: Not on file    Relationship status: Not on file  . Intimate partner violence:    Fear of current or ex partner: Not on file    Emotionally abused: Not on file    Physically abused: Not on file    Forced sexual activity: Not on file  Other Topics Concern  . Not on file  Social History Narrative  . Not on file    Outpatient Medications Prior to Visit  Medication Sig Dispense Refill  . albuterol (PROVENTIL HFA;VENTOLIN HFA) 108 (90 Base) MCG/ACT inhaler Inhale 2 puffs  into the lungs every 4 (four) hours as needed for wheezing or shortness of breath (cough, shortness of breath or wheezing.). 1 Inhaler 1  . atorvastatin (LIPITOR) 80 MG tablet Take 1 tablet (80 mg total) by mouth daily. 90 tablet 1  . benzonatate (TESSALON) 100 MG capsule Take 1-2 capsules (100-200 mg total) by mouth 3 (three) times daily as needed for cough. 60 capsule 0  . clopidogrel (PLAVIX) 75 MG tablet Take 1 tablet (75 mg total) by mouth daily. 90 tablet 1  . isosorbide mononitrate (IMDUR) 60 MG 24 hr tablet Take 1 tablet (60 mg total) by mouth daily. 90 tablet 1  . lisinopril (PRINIVIL,ZESTRIL) 40 MG tablet Take 1 tablet (40 mg total) by mouth daily. 90 tablet 1  . metFORMIN (GLUCOPHAGE) 500 MG tablet Take 1 tablet (500 mg total) by mouth 2  (two) times daily with a meal. 180 tablet 1  . metoprolol tartrate (LOPRESSOR) 100 MG tablet Take 1 tablet (100 mg total) by mouth 2 (two) times daily. 180 tablet 1  . metoprolol tartrate (LOPRESSOR) 25 MG tablet Take 1 tablet (25 mg total) by mouth 2 (two) times daily. 180 tablet 1  . oseltamivir (TAMIFLU) 75 MG capsule Take 1 capsule (75 mg total) by mouth 2 (two) times daily. 10 capsule 0   No facility-administered medications prior to visit.     No Known Allergies  ROS Review of Systems    Objective:    Physical Exam  BP (!) 157/81   Pulse 83   Resp 18   Ht 5\' 4"  (1.626 m)   Wt 178 lb 3.2 oz (80.8 kg)   SpO2 96%   BMI 30.59 kg/m  Wt Readings from Last 3 Encounters:  12/14/18 178 lb 3.2 oz (80.8 kg)  12/09/18 177 lb 12.8 oz (80.6 kg)     Health Maintenance Due  Topic Date Due  . Hepatitis C Screening  04/30/48  . FOOT EXAM  06/23/1958  . OPHTHALMOLOGY EXAM  06/23/1958  . TETANUS/TDAP  06/24/1967  . MAMMOGRAM  06/23/1998  . COLONOSCOPY  06/23/1998  . DEXA SCAN  06/23/2013  . PNA vac Low Risk Adult (1 of 2 - PCV13) 06/23/2013  . INFLUENZA VACCINE  06/23/2018    There are no preventive care reminders to display for this patient.  Lab Results  Component Value Date   TSH 3.370 12/09/2018   Lab Results  Component Value Date   WBC 8.9 12/09/2018   HGB 11.1 12/09/2018   HCT 35.4 12/09/2018   MCV 69 (L) 12/09/2018   PLT 257 12/09/2018   Lab Results  Component Value Date   NA 143 12/09/2018   K 4.3 12/09/2018   CO2 24 12/09/2018   GLUCOSE 104 (H) 12/09/2018   BUN 13 12/09/2018   CREATININE 0.76 12/09/2018   BILITOT <0.2 12/09/2018   ALKPHOS 89 12/09/2018   AST 35 12/09/2018   ALT 48 (H) 12/09/2018   PROT 7.5 12/09/2018   ALBUMIN 4.4 12/09/2018   CALCIUM 9.1 12/09/2018   Lab Results  Component Value Date   CHOL 145 12/09/2018   Lab Results  Component Value Date   HDL 40 12/09/2018   Lab Results  Component Value Date   LDLCALC 85  12/09/2018   Lab Results  Component Value Date   TRIG 99 12/09/2018   Lab Results  Component Value Date   CHOLHDL 3.6 12/09/2018   Lab Results  Component Value Date   HGBA1C 7.1 (H) 12/09/2018  Assessment & Plan:  1. Influenza B - DG Chest 2 View; Future Chest x-ray is normal.  Use symptomatic treatment and complete Tamiflu.  2. Type 2 diabetes mellitus with other circulatory complication, without long-term current use of insulin (HCC) - Glucose (CBG) 203, and fasting recently ate breakfast. Aim for 30 minutes of exercise most days, with a goal of 150 minutes per week. -Glucose monitoring at minimal of twice daily and keep a log of readings. -Commit to medication adherence and self-adjustment as needed -increase foods containing whole grains (one-half of grain intake). -saturated fat intake should be reduced -reduce intake of trans fat (lowers LDL cholesterol and increases HDL cholesterol) -Eat 4-5 small meals during the day to reduce the risk of becoming hungry.   3. Essential hypertension, elevated today. Patient has not taken blood pressure medication today. Return 2-3 weeks for a blood pressure follow-up.  Take medication prior to visit   4. Mixed hyperlipidemia Recent lipid panel normal Continue Statin therapy as prescribed.  5. SOB (shortness of breath) 6. Wheezing - DG Chest 2 View; Future 10 you albuterol and benzonatate as prescribed.  X-ray was normal. Dg Chest 2 View  Result Date: 12/14/2018 CLINICAL DATA:  Shortness of breath, cough, and wheezing for 2 weeks. Influenzae B. EXAM: CHEST - 2 VIEW COMPARISON:  None. FINDINGS: The heart size and mediastinal contours are within normal limits. Both lungs are clear. The visualized skeletal structures are unremarkable. IMPRESSION: No active cardiopulmonary disease. Electronically Signed   By: Myles Rosenthal M.D.   On: 12/14/2018 17:27      Meds ordered this encounter  Medications  . metFORMIN (GLUCOPHAGE) 500  MG tablet    Sig: Take 2 tablets by mouth in the with breakfast and take 1 tablet by mouth with dinner.    Dispense:  180 tablet    Refill:  1    Pt will pick up when needed    Follow-up: Return in about 3 months (around 03/15/2019) for 3 weeks BP check and 3 months with provider.    Joaquin Courts, FNP

## 2018-12-14 NOTE — Patient Instructions (Signed)
Hipertensin Hypertension Introduccin La hipertensin, conocida comnmente como presin arterial alta, se produce cuando la sangre bombea en las arterias con mucha fuerza. Las arterias son los vasos sanguneos que transportan la sangre desde el corazn al resto del cuerpo. La hipertensin hace que el corazn haga ms esfuerzo para Insurance account manager y [employer] que las arterias se Armed forces training and education officer o Multimedia programmer. La hipertensin no tratada o no controlada puede causar infarto de miocardio, accidentes cerebrovasculares, enfermedad renal y otros problemas. Una lectura de la presin arterial consiste de un nmero ms alto sobre un nmero ms bajo. En condiciones ideales, la presin arterial debe estar por debajo de 120/80. El primer nmero ("superior") es la presin sistlica. Es la medida de la presin de las arterias cuando el corazn late. El segundo nmero ("inferior") es la presin diastlica. Es la medida de la presin en las arterias cuando el corazn se relaja. Cules son las causas? Se desconoce la causa de esta afeccin. Qu incrementa el riesgo? Algunos factores de riesgo de hipertensin estn bajo su control. Otros no. Factores que puede [employer].  Tener diabetes mellitus tipo 2, colesterol alto, o ambos.  No hacer la cantidad suficiente de actividad fsica o ejercicio.  Tener sobrepeso.  Consumir mucha grasa, azcar, caloras o sal (sodio) en su dieta.  Beber alcohol en exceso. Factores que son difciles o imposibles de modificar  Tener enfermedad renal crnica.  Tener antecedentes familiares de presin arterial alta.  La edad. Los riesgos aumentan con la edad.  La raza. El riesgo es mayor para las Statistician.  El sexo. Antes de los 45aos, los hombres corren ms [employer]. Despus de los 65aos, las mujeres corren ms [employer].  Tener apnea obstructiva del sueo.  El estrs. Cules son los signos o los sntomas? La presin  arterial extremadamente alta (crisis hipertensiva) puede provocar:  Dolor de [place].  Ansiedad.  Falta de aire.  Hemorragia nasal.  Nuseas y vmitos.  Dolor de pecho intenso.  Una crisis de movimientos que no puede controlar (convulsiones). Cmo se diagnostica? Esta afeccin se diagnostica midiendo su presin arterial mientras se encuentra sentado, con el brazo apoyado sobre una superficie. El brazalete del tensimetro debe colocarse directamente sobre la piel de la parte superior del brazo y al nivel de su corazn. Debe medirla al [facility] veces en el mismo brazo. Determinadas condiciones pueden causar una diferencia de presin arterial entre el brazo izquierdo y Aeronautical engineer. Ciertos factores pueden provocar que las lecturas de la presin arterial sean inferiores o superiores a lo normal (elevadas) por un perodo corto de tiempo:  Si su presin arterial es ms alta cuando se encuentra en el consultorio del mdico que cuando la mide en su hogar, se denomina "hipertensin de bata blanca". La [employer]son de las personas que tienen esta afeccin no deben ser [employer].  Si su presin arterial es ms alta en el hogar que cuando se encuentra en el consultorio del mdico, se denomina "hipertensin enmascarada". La Harley[employer]son de las personas que tienen esta afeccin deben ser medicadas para Chief Operating Officer la presin arterial. Si tiene una lecturas de presin arterial alta durante una visita o si tiene presin arterial normal con otros factores de riesgo:  Es posible que se le pida que regrese Banker para volver a Chief Operating Officer su presin arterial.  Se le puede pedir que se controle la presin arterial en su casa durante 1 semana o ms. Si se le diagnostica hipertensin, es posible que se le  realicen otros anlisis de sangre o estudios de diagnstico por imgenes para ayudar a su mdico a comprender su riesgo general de tener otras afecciones. Cmo se trata? Esta afeccin se trata haciendo cambios  saludables en el estilo de vida, tales como ingerir alimentos saludables, realizar ms ejercicio y reducir el consumo de alcohol. El mdico puede recetarle medicamentos si los cambios en el estilo de vida no son suficientes para Museum/gallery curator la presin arterial y si:  Su presin arterial sistlica est por encima de 130.  Su presin arterial diastlica est por encima de 80. La presin arterial deseada puede variar en funcin de las enfermedades, la edad y otros factores personales. Siga estas instrucciones en su casa: Comida y bebida   Siga una dieta con alto contenido de fibras y [place], y con bajo contenido de sodio, International aid/development worker agregada y Neurosurgeon. Un ejemplo de plan alimenticio es la dieta DASH (Dietary Approaches to Stop Hypertension, Mtodos alimenticios para detener la hipertensin). Para alimentarse de esta manera: ? Coma mucha fruta y verdura fresca. Trate de que la mitad del plato de cada comida sea de frutas y verduras. ? Coma cereales integrales, como pasta integral, arroz integral y pan integral. Llene aproximadamente un cuarto del plato con cereales integrales. ? Coma y beba productos lcteos con bajo contenido de grasa, como leche descremada o yogur bajo en grasas. ? Evite la ingesta de cortes de carne grasa, carne procesada o curada, y carne de ave con piel. Llene aproximadamente un cuarto del plato con protenas magras, como pescado, pollo sin piel, frijoles, huevos y tofu. ? Evite ingerir alimentos prehechos o procesados. En general, estos tienen mayor cantidad de sodio, azcar agregada y Steffanie [place].  Reduzca su ingesta diaria de sodio. La mayora de las personas que tienen hipertensin deben comer menos de 1500 mg de sodio por [employer].  Limite el consumo de alcohol a no ms de 1 medida por da si es mujer y no est Orthoptist y a 2 medidas por da si es hombre. Una medida equivale a 12onzas de cerveza, 5onzas de vino o 1onzas de bebidas alcohlicas de alta graduacin. Estilo de  vida  Trabaje con su mdico para mantener un peso saludable o Curator. Pregntele cual es su peso recomendado.  Realice al menos 30 minutos de ejercicio que haga que se acelere su corazn (ejercicio Magazine features editor) la [employer] de la [place]. Estas actividades pueden incluir caminar, nadar o andar en bicicleta.  Incluya ejercicios para fortalecer sus msculos (ejercicios de resistencia), como pilates o levantamiento de pesas, como parte de su rutina semanal de ejercicios. Intente realizar de este tipo de ejercicios al [employer] a la [place].  No consuma ningn producto que contenga nicotina o tabaco, como cigarrillos y Administrator, Civil Service. Si necesita ayuda para dejar de fumar, consulte al mdico.  Contrlese la presin arterial en su casa segn las indicaciones del mdico.  Concurra a todas las visitas de control como se lo haya indicado el mdico. Esto es importante. Medicamentos  [employer] de venta libre y los recetados solamente como se lo haya indicado el mdico. Siga cuidadosamente las indicaciones. Los medicamentos para la presin arterial deben tomarse segn las indicaciones.  No omita las dosis de medicamentos para la presin arterial. Si lo hace, estar en riesgo de tener problemas y puede hacer que los medicamentos sean menos eficaces.  Pregntele a su mdico a qu efectos secundarios o reacciones a los Museum/gallery curator. Comunquese con un mdico  si:  Piensa que tiene una reaccin a un medicamento que est tomando.  Tiene dolores de cabeza frecuentes (recurrentes).  Siente mareos.  Tiene hinchazn en los tobillos.  Tiene problemas de visin. Solicite ayuda de inmediato si:  Siente un dolor de cabeza intenso o confusin.  Siente debilidad inusual o adormecimiento.  Siente que va a desmayarse.  Siente un dolor intenso en el pecho o el abdomen.  Vomita repetidas veces.  Tiene dificultad para  respirar. Resumen  La hipertensin se produce cuando la sangre bombea en las arterias con mucha fuerza. Si esta afeccin no se controla, podra correr riesgo de tener complicaciones graves.  La presin arterial deseada puede variar en funcin de las enfermedades, la edad y otros factores personales. Para la Franklin Resourcesmayora de las personas, una presin arterial normal es menor que 120/80.  La hipertensin se trata con cambios en el estilo de vida, medicamentos o una combinacin de Cornellambos. Los Danaher Corporationcambios en el estilo de vida incluyen prdida de peso, ingerir alimentos sanos, seguir una dieta baja en sodio, hacer ms ejercicio y Glass blower/designerlimitar el consumo de alcohol. Esta informacin no tiene Theme park managercomo fin reemplazar el consejo del mdico. Asegrese de hacerle al mdico cualquier pregunta que tenga. Document Released: 11/09/2005 Document Revised: 10/21/2016 Document Reviewed: 10/21/2016 Elsevier Interactive Patient Education  2019 Elsevier Inc.     DASH Eating Plan DASH stands for "Dietary Approaches to Stop Hypertension." The DASH eating plan is a healthy eating plan that has been shown to reduce high blood pressure (hypertension). It may also reduce your risk for type 2 diabetes, heart disease, and stroke. The DASH eating plan may also help with weight loss. What are tips for following this plan?  General guidelines  Avoid eating more than 2,300 mg (milligrams) of salt (sodium) a day. If you have hypertension, you may need to reduce your sodium intake to 1,500 mg a day.  Limit alcohol intake to no more than 1 drink a day for nonpregnant women and 2 drinks a day for men. One drink equals 12 oz of beer, 5 oz of wine, or 1 oz of hard liquor.  Work with your health care provider to maintain a healthy body weight or to lose weight. Ask what an ideal weight is for you.  Get at least 30 minutes of exercise that causes your heart to beat faster (aerobic exercise) most days of the week. Activities may include walking,  swimming, or biking.  Work with your health care provider or diet and nutrition specialist (dietitian) to adjust your eating plan to your individual calorie needs. Reading food labels   Check food labels for the amount of sodium per serving. Choose foods with less than 5 percent of the Daily Value of sodium. Generally, foods with less than 300 mg of sodium per serving fit into this eating plan.  To find whole grains, look for the word "whole" as the first word in the ingredient list. Shopping  Buy products labeled as "low-sodium" or "no salt added."  Buy fresh foods. Avoid canned foods and premade or frozen meals. Cooking  Avoid adding salt when cooking. Use salt-free seasonings or herbs instead of table salt or sea salt. Check with your health care provider or pharmacist before using salt substitutes.  Do not fry foods. Cook foods using healthy methods such as baking, boiling, grilling, and broiling instead.  Cook with heart-healthy oils, such as olive, canola, soybean, or sunflower oil. Meal planning  Eat a balanced diet that includes: ? 5 or  more servings of fruits and vegetables each day. At each meal, try to fill half of your plate with fruits and vegetables. ? Up to 6-8 servings of whole grains each day. ? Less than 6 oz of lean meat, poultry, or fish each day. A 3-oz serving of meat is about the same size as a deck of cards. One egg equals 1 oz. ? 2 servings of low-fat dairy each day. ? A serving of nuts, seeds, or beans 5 times each week. ? Heart-healthy fats. Healthy fats called Omega-3 fatty acids are found in foods such as flaxseeds and coldwater fish, like sardines, salmon, and mackerel.  Limit how much you eat of the following: ? Canned or prepackaged foods. ? Food that is high in trans fat, such as fried foods. ? Food that is high in saturated fat, such as fatty meat. ? Sweets, desserts, sugary drinks, and other foods with added sugar. ? Full-fat dairy  products.  Do not salt foods before eating.  Try to eat at least 2 vegetarian meals each week.  Eat more home-cooked food and less restaurant, buffet, and fast food.  When eating at a restaurant, ask that your food be prepared with less salt or no salt, if possible. What foods are recommended? The items listed may not be a complete list. Talk with your dietitian about what dietary choices are best for you. Grains Whole-grain or whole-wheat bread. Whole-grain or whole-wheat pasta. Brown rice. Orpah Cobbatmeal. Quinoa. Bulgur. Whole-grain and low-sodium cereals. Pita bread. Low-fat, low-sodium crackers. Whole-wheat flour tortillas. Vegetables Fresh or frozen vegetables (raw, steamed, roasted, or grilled). Low-sodium or reduced-sodium tomato and vegetable juice. Low-sodium or reduced-sodium tomato sauce and tomato paste. Low-sodium or reduced-sodium canned vegetables. Fruits All fresh, dried, or frozen fruit. Canned fruit in natural juice (without added sugar). Meat and other protein foods Skinless chicken or [place]. Ground chicken or Mal[place]urkey. Pork with fat trimmed off. Fish and seafood. Egg whites. Dried beans, peas, or lentils. Unsalted nuts, nut butters, and seeds. Unsalted canned beans. Lean cuts of beef with fat trimmed off. Low-sodium, lean deli meat. Dairy Low-fat (1%) or fat-free (skim) milk. Fat-free, low-fat, or reduced-fat cheeses. Nonfat, low-sodium ricotta or cottage cheese. Low-fat or nonfat yogurt. Low-fat, low-sodium cheese. Fats and oils Soft margarine without trans fats. Vegetable oil. Low-fat, reduced-fat, or light mayonnaise and salad dressings (reduced-sodium). Canola, safflower, olive, soybean, and sunflower oils. Avocado. Seasoning and other foods Herbs. Spices. Seasoning mixes without salt. Unsalted popcorn and pretzels. Fat-free sweets. What foods are not recommended? The items listed may not be a complete list. Talk with your dietitian about what dietary choices are best for  you. Grains Baked goods made with fat, such as croissants, muffins, or some breads. Dry pasta or rice meal packs. Vegetables Creamed or fried vegetables. Vegetables in a cheese sauce. Regular canned vegetables (not low-sodium or reduced-sodium). Regular canned tomato sauce and paste (not low-sodium or reduced-sodium). Regular tomato and vegetable juice (not low-sodium or reduced-sodium). Rosita FirePickles. Olives. Fruits Canned fruit in a light or heavy syrup. Fried fruit. Fruit in cream or butter sauce. Meat and other protein foods Fatty cuts of meat. Ribs. Fried meat. Tomasa BlaseBacon. Sausage. Bologna and other processed lunch meats. Salami. Fatback. Hotdogs. Bratwurst. Salted nuts and seeds. Canned beans with added salt. Canned or smoked fish. Whole eggs or egg yolks. Chicken or Malawiturkey with skin. Dairy Whole or 2% milk, cream, and half-and-half. Whole or full-fat cream cheese. Whole-fat or sweetened yogurt. Full-fat cheese. Nondairy creamers. Whipped toppings. Processed cheese and  cheese spreads. Fats and oils Butter. Stick margarine. Lard. Shortening. Ghee. Bacon fat. Tropical oils, such as coconut, palm kernel, or palm oil. Seasoning and other foods Salted popcorn and pretzels. Onion salt, garlic salt, seasoned salt, table salt, and sea salt. Worcestershire sauce. Tartar sauce. Barbecue sauce. Teriyaki sauce. Soy sauce, including reduced-sodium. Steak sauce. Canned and packaged gravies. Fish sauce. Oyster sauce. Cocktail sauce. Horseradish that you find on the shelf. Ketchup. Mustard. Meat flavorings and tenderizers. Bouillon cubes. Hot sauce and Tabasco sauce. Premade or packaged marinades. Premade or packaged taco seasonings. Relishes. Regular salad dressings. Where to find more information:  National Heart, Lung, and Blood Institute: PopSteam.is  American Heart Association: www.heart.org Summary  The DASH eating plan is a healthy eating plan that has been shown to reduce high blood pressure  (hypertension). It may also reduce your risk for type 2 diabetes, heart disease, and stroke.  With the DASH eating plan, you should limit salt (sodium) intake to 2,300 mg a day. If you have hypertension, you may need to reduce your sodium intake to 1,500 mg a day.  When on the DASH eating plan, aim to eat more fresh fruits and vegetables, whole grains, lean proteins, low-fat dairy, and heart-healthy fats.  Work with your health care provider or diet and nutrition specialist (dietitian) to adjust your eating plan to your individual calorie needs. This information is not intended to replace advice given to you by your health care provider. Make sure you discuss any questions you have with your health care provider. Document Released: 10/29/2011 Document Revised: 11/02/2016 Document Reviewed: 11/02/2016 Elsevier Interactive Patient Education  2019 [employer].

## 2018-12-15 ENCOUNTER — Telehealth: Payer: Self-pay | Admitting: Family Medicine

## 2018-12-15 NOTE — Telephone Encounter (Signed)
Called patient, patient verified DOB, expressed understanding about the results of her x-ray and had no further questions or concerns.

## 2018-12-15 NOTE — Telephone Encounter (Signed)
Please contact patient, she will need Spanish interpretation: Contact patient to advise recent chest x-ray was negative for pneumonia.  Continue albuterol as needed for wheezing or shortness of breath.  If symptoms worsen or do not improve go to the emergency department or urgent care or follow-up here in office.

## 2018-12-19 ENCOUNTER — Encounter: Payer: Self-pay | Admitting: Family Medicine

## 2018-12-19 ENCOUNTER — Encounter: Payer: Self-pay | Admitting: Emergency Medicine

## 2018-12-26 ENCOUNTER — Telehealth: Payer: Self-pay | Admitting: Family Medicine

## 2018-12-26 NOTE — Telephone Encounter (Signed)
Not certain what happened to patient's medication list. All medications were duplicated. She is to take metoprolol 100 mg twice daily in additional to 25 mg of metoprolol twice daily.

## 2018-12-26 NOTE — Telephone Encounter (Signed)
Which Metoprolol dose is patient supposed to be on? 100 mg & 50 mg BID or 100 mg & 25 mg BID?

## 2018-12-26 NOTE — Telephone Encounter (Signed)
Patient called stating she keeps receiving calls from her pharmacy telling her that her  metoprolol tartrate is ready for pick up. Patient states that she has 4 medications with different dosage. Patient would like to know what dosage she needs to be taking. Please f/u.  Patient requests for a spanish interpreter.

## 2018-12-27 NOTE — Telephone Encounter (Signed)
Attempted to call patient.  Voice mail was full, unable to leave message.

## 2018-12-28 NOTE — Telephone Encounter (Signed)
Spoke to patient by assist of interpreter  [phone].  Advised dose on Metorprolo. Instructed to call office for dizziness or low heart rate less than 60. Pt verbalized understanding.   She does not understand why she has to pick up this medication so frequently at the pharmacy. It is too expensive.  [facility]: [phone].  Advised to call pharmacy to check on the price there to compare.    Reminded of appointment with PCP.

## 2019-01-04 ENCOUNTER — Ambulatory Visit
Admission: EM | Admit: 2019-01-04 | Discharge: 2019-01-04 | Disposition: A | Discharge status: Home / Self Care | Payer: Medicare Other | Attending: Family Medicine | Admitting: Family Medicine

## 2019-01-04 ENCOUNTER — Encounter: Payer: Self-pay | Admitting: Emergency Medicine

## 2019-01-04 ENCOUNTER — Ambulatory Visit: Payer: Medicare Other

## 2019-01-04 VITALS — BP 122/72 | HR 80

## 2019-01-04 DIAGNOSIS — M5432 Sciatica, left side: Secondary | ICD-10-CM | POA: Diagnosis not present

## 2019-01-04 DIAGNOSIS — I10 Essential (primary) hypertension: Secondary | ICD-10-CM

## 2019-01-04 MED ORDER — ATORVASTATIN CALCIUM 80 MG PO TABS: 80.0000 mg | ORAL_TABLET | Freq: Every day | ORAL | 1 refills | Status: DC

## 2019-01-04 MED ORDER — AMLODIPINE BESYLATE 10 MG PO TABS: 10.0000 mg | ORAL_TABLET | Freq: Every day | ORAL | 3 refills | Status: DC

## 2019-01-04 MED ORDER — METOPROLOL TARTRATE 100 MG PO TABS: 100.0000 mg | ORAL_TABLET | Freq: Two times a day (BID) | ORAL | 1 refills | Status: DC

## 2019-01-04 MED ORDER — ISOSORBIDE MONONITRATE ER 60 MG PO TB24: 60.0000 mg | ORAL_TABLET | Freq: Every day | ORAL | 1 refills | Status: DC

## 2019-01-04 MED ORDER — TRAMADOL HCL 50 MG PO TABS: 50.0000 mg | ORAL_TABLET | Freq: Four times a day (QID) | ORAL | 0 refills | Status: DC | PRN

## 2019-01-04 MED ORDER — LISINOPRIL 40 MG PO TABS: 40.0000 mg | ORAL_TABLET | Freq: Every day | ORAL | 1 refills | Status: DC

## 2019-01-04 MED ORDER — PREDNISONE 10 MG (21) PO TBPK: ORAL_TABLET | Freq: Every day | ORAL | 0 refills | Status: DC

## 2019-01-04 MED ORDER — METOPROLOL TARTRATE 25 MG PO TABS: 25.0000 mg | ORAL_TABLET | Freq: Two times a day (BID) | ORAL | 1 refills | Status: DC

## 2019-01-04 MED ORDER — METFORMIN HCL 500 MG PO TABS: ORAL_TABLET | ORAL | 1 refills | Status: DC

## 2019-01-04 MED ORDER — CLOPIDOGREL BISULFATE 75 MG PO TABS: 75.0000 mg | ORAL_TABLET | Freq: Every day | ORAL | 1 refills | Status: DC

## 2019-01-04 NOTE — ED Triage Notes (Signed)
Pt presents to [facility] for assessment of lower back pain that radiates down her left leg.  Pt states when she lifts and pulls it makes it hurt worse.  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

## 2019-01-04 NOTE — Discharge Instructions (Signed)
Be aware, pain medications may cause drowsiness. Please do not drive, operate heavy machinery or make important decisions while on this medication, it can cloud your judgement.  

## 2019-01-04 NOTE — Progress Notes (Signed)
Patient previously prescribed amlodipine at [facility]. Out for a few weeks. Refilled today and advised nurse to have patient return in 4 weeks for a hypertension follow-up visit with  provider

## 2019-01-07 NOTE — ED Provider Notes (Signed)
[facility] CARE CENTER   [id number] 01/04/19 Arrival Time: 1637  ASSESSMENT & PLAN:  1. Sciatica of left side    Able to ambulate here and hemodynamically stable. No indication for imaging of back at this time given no trauma and normal neurological exam. Discussed.  Meds ordered this encounter  Medications  . predniSONE (STERAPRED UNI-PAK 21 TAB) 10 MG (21) TBPK tablet    Sig: Take by mouth daily. Take as directed.    Dispense:  21 tablet    Refill:  0  . traMADol (ULTRAM) 50 MG tablet    Sig: Take 1 tablet (50 mg total) by mouth every 6 (six) hours as needed.    Dispense:  15 tablet    Refill:  0   [place] Controlled Substances Registry consulted for this patient. I feel the risk/benefit ratio today is favorable for proceeding with this prescription for a controlled substance. Medication sedation precautions given.  Encourage ROM/movement as tolerated.  Follow-up Information    Bing Neighbors, FNP.   Specialty:  Family Medicine Why:  If symptoms worsen. Contact information: [address] [place] [address] [phone]           Discharge Instructions     Be aware, pain medications may cause drowsiness. Please do not drive, operate heavy machinery or make important decisions while on this medication, it can cloud your judgement.     Reviewed expectations re: course of current medical issues. Questions answered. Outlined signs and symptoms indicating need for more acute intervention. Patient verbalized understanding. After Visit Summary given.   SUBJECTIVE: History from: patient.  Erin Estes is a 73 y.o. female who presents with complaint of intermittent left sided lower back/buttock discomfort. Onset gradual, over the past few weeks. Injury/trama: no. History of back problems: occasional and with similar symptoms; usually resolves after several days. Discomfort described as aching and with occasional sharp exacerbations that do radiate  to posterior left thigh. Pain worsens with movement over her left leg and is diminished with rest. Progressive LE weakness or saddle anesthesia: none. Extremity sensation changes or weakness: none. Ambulatory without difficulty. Normal bowel/bladder habits: yes without urinary retention. No associated abdominal pain/n/v. Self treatment: has had no relief from OTC pain medications (Aleve).  Reports no chronic steroid use, fevers, IV drug use, or recent back surgeries or procedures.  ROS: As per HPI. All other systems negative.  OBJECTIVE:  Vitals:   01/04/19 1707  BP: (!) 182/91  Pulse: 73  Resp: 18  Temp: 97.9 F (36.6 C)  TempSrc: Oral  SpO2: 95%    General appearance: alert; no distress Neck: supple with FROM; without midline tenderness CV: RRR without murmer Lungs: unlabored respirations; symmetrical air entry Abdomen: soft, non-tender; non-distended Back: moderate left sided tenderness of her lower back/buttock that is poorly localized; FROM at waist; bruising: none; without midline tenderness; reports pain from buttock to posterior left leg with SLR Extremities: no edema; symmetrical with no gross deformities; normal ROM of bilateral lower extremities Skin: warm and dry Neurologic: normal gait; normal reflexes of RLE and LLE; normal sensation of RLE and LLE; normal strength of RLE and LLE Psychological: alert and cooperative; normal mood and affect  Labs: Results for orders placed or performed in visit on 12/14/18  Glucose (CBG)  Result Value Ref Range   POC Glucose 203 (A) 70 - 99 mg/dl   Labs Reviewed - No data to display  No Known Allergies  Past Medical History:  Diagnosis Date  .  CAD (coronary artery disease)   . Diabetes mellitus without complication (HCC)   . Hyperlipidemia   . Hypertension    Social History   Socioeconomic History  . Marital status: Significant Other    Spouse name: Not on file  . Number of children: Not on file  . Years of education:  Not on file  . Highest education level: Not on file  Occupational History  . Not on file  Social Needs  . Financial resource strain: Not on file  . Food insecurity:    Worry: Not on file    Inability: Not on file  . Transportation needs:    Medical: Not on file    Non-medical: Not on file  Tobacco Use  . Smoking status: Never Smoker  . Smokeless tobacco: Never Used  Substance and Sexual Activity  . Alcohol use: Not Currently    Frequency: Never  . Drug use: Never  . Sexual activity: Not on file  Lifestyle  . Physical activity:    Days per week: Not on file    Minutes per session: Not on file  . Stress: Not on file  Relationships  . Social connections:    Talks on phone: Not on file    Gets together: Not on file    Attends religious service: Not on file    Active member of club or organization: Not on file    Attends meetings of clubs or organizations: Not on file    Relationship status: Not on file  . Intimate partner violence:    Fear of current or ex partner: Not on file    Emotionally abused: Not on file    Physically abused: Not on file    Forced sexual activity: Not on file  Other Topics Concern  . Not on file  Social History Narrative   ** Merged History Encounter **       Family History  Problem Relation Age of Onset  . Hypertension Sister   . Hypertension Sister   . Stroke Neg Hx    Past Surgical History:  Procedure Laterality Date  . CORONARY ANGIOPLASTY N/A   . HEMORRHOID SURGERY    . KNEE SURGERY Left      Mardella Layman, MD 01/07/19 [phone]

## 2019-01-11 ENCOUNTER — Encounter: Payer: Self-pay | Admitting: Internal Medicine

## 2019-02-01 ENCOUNTER — Ambulatory Visit: Payer: Medicare Other | Admitting: Family Medicine

## 2019-02-28 ENCOUNTER — Telehealth: Payer: Self-pay

## 2019-02-28 NOTE — Telephone Encounter (Signed)
   Contacted pt with interpreter to change appointment to virtual visit. Pt states she is feeling fine and would prefer to reschedule appointment.    Cardiac Questionnaire:    Since your last visit or hospitalization:    1. Have you been having new or worsening chest pain? No   2. Have you been having new or worsening shortness of breath? No 3. Have you been having new or worsening leg swelling, wt gain, or increase in abdominal girth (pants fitting more tightly)? No   4. Have you had any passing out spells? Pt state she got a little dizzy once but it quickly passed.        Primary Cardiologist:  No primary care provider on file.   Patient contacted.  History reviewed.  No symptoms to suggest any unstable cardiac conditions.  Based on discussion, with current pandemic situation, we will be postponing this appointment for [facility] with a plan for f/u in 3 mo or sooner if feasible/necessary.  If symptoms change, she has been instructed to contact our office.   Routing to C19 CANCEL pool for tracking (P CV DIV CV19 CANCEL - reason for visit "other.") and assigning priority (1 = 4-6 wks, 2 = 6-12 wks, 3 = >12 wks).   Parke Poisson, RN  02/28/2019 4:20 PM         .

## 2019-03-07 ENCOUNTER — Ambulatory Visit: Payer: Medicare Other | Admitting: Internal Medicine

## 2019-05-25 ENCOUNTER — Encounter: Payer: Self-pay | Admitting: Family Medicine

## 2019-05-25 ENCOUNTER — Other Ambulatory Visit: Payer: Self-pay

## 2019-05-25 ENCOUNTER — Ambulatory Visit: Payer: Medicare Other | Admitting: Family Medicine

## 2019-05-29 ENCOUNTER — Ambulatory Visit: Payer: Medicare Other | Admitting: Family Medicine

## 2019-05-29 NOTE — Progress Notes (Deleted)
Patient ID: Erin Estes, female    DOB: [birth year], 73 y.o.   MRN: [id number]  PCP: Scot Jun, FNP  No chief complaint on file.  Interpreter used:  Subjective:  HPI  Erin Estes is a 73 y.o. female presents for hypertension, hyperlipidemia  and diabetes follow-up.  Erin Estes has HYPERLIPIDEMIA; HYPERTENSION; SCIATICA; Diabetes mellitus ([place]); Essential hypertension; Mixed hyperlipidemia; and CAD, multiple vessel, 2 stent  on their problem list.    Erin Estes has had poorly controlled hypertension.   Social History   Socioeconomic History  . Marital status: Significant Other    Spouse name: Not on file  . Number of children: Not on file  . Years of education: Not on file  . Highest education level: Not on file  Occupational History  . Not on file  Social Needs  . Financial resource strain: Not on file  . Food insecurity    Worry: Not on file    Inability: Not on file  . Transportation needs    Medical: Not on file    Non-medical: Not on file  Tobacco Use  . Smoking status: Never Smoker  . Smokeless tobacco: Never Used  Substance and Sexual Activity  . Alcohol use: Not Currently    Frequency: Never  . Drug use: Never  . Sexual activity: Not on file  Lifestyle  . Physical activity    Days per week: Not on file    Minutes per session: Not on file  . Stress: Not on file  Relationships  . Social Herbalist on phone: Not on file    Gets together: Not on file    Attends religious service: Not on file    Active member of club or organization: Not on file    Attends meetings of clubs or organizations: Not on file    Relationship status: Not on file  . Intimate partner violence    Fear of current or ex partner: Not on file    Emotionally abused: Not on file    Physically abused: Not on file    Forced sexual activity: Not on file  Other Topics Concern  . Not on file  Social History Narrative   ** Merged History Encounter  **        Family History  Problem Relation Age of Onset  . Hypertension Sister   . Hypertension Sister   . Stroke Neg Hx      Review of Systems  No Known Allergies  Prior to Admission medications   Medication Sig Start Date End Date Taking? Authorizing Provider  amLODipine (NORVASC) 10 MG tablet Take 1 tablet (10 mg total) by mouth daily. 01/04/19   Scot Jun, FNP  atorvastatin (LIPITOR) 80 MG tablet Take 1 tablet (80 mg total) by mouth daily. 01/04/19   Scot Jun, FNP  clopidogrel (PLAVIX) 75 MG tablet Take 1 tablet (75 mg total) by mouth daily. 01/04/19   Scot Jun, FNP  isosorbide mononitrate (IMDUR) 60 MG 24 hr tablet Take 1 tablet (60 mg total) by mouth daily. 01/04/19   Scot Jun, FNP  lisinopril (PRINIVIL,ZESTRIL) 40 MG tablet Take 1 tablet (40 mg total) by mouth daily. 01/04/19   Scot Jun, FNP  metFORMIN (GLUCOPHAGE) 500 MG tablet Take 2 tablets by mouth in the with breakfast and take 1 tablet by mouth with dinner. 01/04/19   Scot Jun, FNP  metoprolol tartrate (LOPRESSOR) 100 MG tablet Take 1 tablet (  100 mg total) by mouth 2 (two) times daily. 01/04/19   Bing NeighborsHarris, Denman Pichardo S, FNP  metoprolol tartrate (LOPRESSOR) 50 MG tablet TAKE 1 TABLET (50 MG TOTAL) BY MOUTH 2 (TWO) TIMES DAILY. IN ADDITION TO THE 100 MG TABLET 03/29/19   [provider]    Past Medical, Surgical Family and Social History reviewed and updated.    Objective:  There were no vitals filed for this visit.  BP Readings from Last 3 Encounters:  01/04/19 (!) 182/91  01/04/19 122/72  12/14/18 (!) 157/81    There were no vitals filed for this visit.     Physical Exam General appearance: alert, well developed, well nourished, cooperative and in no distress Head: Normocephalic, without obvious abnormality, atraumatic Respiratory: Respirations even and unlabored, normal respiratory rate Heart: rate and rhythm normal. No gallop or murmurs noted on exam   Extremities: No gross deformities Skin: Skin color, texture, turgor normal. No rashes seen  Psych: Appropriate mood and affect. Neurologic: Mental status: Alert, oriented to person, place, and time, thought content appropriate.  Lab Results  Component Value Date   POCGLU 203 (A) 12/14/2018   POCGLU 105 (A) 12/09/2018    Lab Results  Component Value Date   HGBA1C 7.1 (H) 12/09/2018            Assessment & Plan:  There are no diagnoses linked to this encounter.     Joaquin CourtsKimberly Marjoria Mancillas, FNP [facility] [address], [place] [id number] [phone]: [phone]

## 2019-05-30 NOTE — Progress Notes (Signed)
Patient rescheduled appointment.

## 2019-05-31 ENCOUNTER — Telehealth: Payer: Self-pay

## 2019-05-31 NOTE — Telephone Encounter (Signed)
Called patient to do their pre-visit COVID screening using [employer](Carlos [phone]).  Call immediately went to voicemail which isn't set up.

## 2019-06-01 ENCOUNTER — Ambulatory Visit (INDEPENDENT_AMBULATORY_CARE_PROVIDER_SITE_OTHER): Payer: Medicare Other | Admitting: Family Medicine

## 2019-06-01 ENCOUNTER — Encounter: Payer: Self-pay | Admitting: Family Medicine

## 2019-06-01 ENCOUNTER — Other Ambulatory Visit: Payer: Self-pay

## 2019-06-01 VITALS — BP 91/53 | HR 64 | Temp 97.2°F | Resp 17 | Ht 63.5 in | Wt 173.0 lb

## 2019-06-01 DIAGNOSIS — E1159 Type 2 diabetes mellitus with other circulatory complications: Secondary | ICD-10-CM

## 2019-06-01 DIAGNOSIS — I959 Hypotension, unspecified: Secondary | ICD-10-CM | POA: Diagnosis not present

## 2019-06-01 DIAGNOSIS — Z1239 Encounter for other screening for malignant neoplasm of breast: Secondary | ICD-10-CM

## 2019-06-01 DIAGNOSIS — I10 Essential (primary) hypertension: Secondary | ICD-10-CM

## 2019-06-01 DIAGNOSIS — Z1329 Encounter for screening for other suspected endocrine disorder: Secondary | ICD-10-CM

## 2019-06-01 DIAGNOSIS — I2510 Atherosclerotic heart disease of native coronary artery without angina pectoris: Secondary | ICD-10-CM | POA: Diagnosis not present

## 2019-06-01 DIAGNOSIS — E782 Mixed hyperlipidemia: Secondary | ICD-10-CM

## 2019-06-01 MED ORDER — BLOOD GLUCOSE METER KIT: PACK | 0 refills | Status: DC

## 2019-06-01 MED ORDER — METOPROLOL SUCCINATE ER 25 MG PO TB24: 25.0000 mg | ORAL_TABLET | Freq: Every day | ORAL | 3 refills | Status: DC

## 2019-06-01 MED ORDER — AMLODIPINE BESYLATE 10 MG PO TABS: 10.0000 mg | ORAL_TABLET | Freq: Every day | ORAL | 3 refills | Status: DC

## 2019-06-01 MED ORDER — METFORMIN HCL 500 MG PO TABS: ORAL_TABLET | ORAL | 3 refills | Status: DC

## 2019-06-01 NOTE — Patient Instructions (Signed)
Mantenimiento de la salud en las mujeres Health Maintenance, Female Adoptar un estilo de vida saludable y recibir atencin preventiva son importantes para promover la salud y el bienestar. Consulte al mdico sobre:  El esquema adecuado para hacerse pruebas y exmenes peridicos.  Cosas que puede hacer por su cuenta para prevenir enfermedades y mantenerse sana. Qu debo saber sobre la dieta, el peso y el ejercicio? Consuma una dieta saludable   Consuma una dieta que incluya muchas verduras, frutas, productos lcteos con bajo contenido de grasa y protenas magras.  No consuma muchos alimentos ricos en grasas slidas, azcares agregados o sodio. Mantenga un peso saludable El ndice de masa muscular (IMC) se utiliza para identificar problemas de peso. Proporciona una estimacin de la grasa corporal basndose en el peso y la altura. Su mdico puede ayudarle a determinar su IMC y a lograr o mantener un peso saludable. Haga ejercicio con regularidad Haga ejercicio con regularidad. Esta es una de las prcticas ms importantes que puede hacer por su salud. La mayora de los adultos deben seguir estas pautas:  Realizar, al menos, 150minutos de actividad fsica por semana. El ejercicio debe aumentar la frecuencia cardaca y hacerlo transpirar (ejercicio de intensidad moderada).  Hacer ejercicios de fortalecimiento por lo menos dos veces por semana. Agregue esto a su plan de ejercicio de intensidad moderada.  Pasar menos tiempo sentados. Incluso la actividad fsica ligera puede ser beneficiosa. Controle sus niveles de colesterol y lpidos en la sangre Comience a realizarse anlisis de lpidos y colesterol en la sangre a los 20aos y luego reptalos cada 5aos. Hgase controlar los niveles de colesterol con mayor frecuencia si:  Sus niveles de lpidos y colesterol son altos.  Es mayor de 40aos.  Presenta un alto riesgo de padecer enfermedades cardacas. Qu debo saber sobre las pruebas de  deteccin del cncer? Segn su historia clnica y sus antecedentes familiares, es posible que deba realizarse pruebas de deteccin del cncer en diferentes edades. Esto puede incluir pruebas de deteccin de lo siguiente:  Cncer de mama.  Cncer de cuello uterino.  Cncer colorrectal.  Cncer de piel.  Cncer de pulmn. Qu debo saber sobre la enfermedad cardaca, la diabetes y la hipertensin arterial? Presin arterial y enfermedad cardaca  La hipertensin arterial causa enfermedades cardacas y aumenta el riesgo de accidente cerebrovascular. Es ms probable que esto se manifieste en las personas que tienen lecturas de presin arterial alta, tienen ascendencia africana o tienen sobrepeso.  Hgase controlar la presin arterial: ? Cada 3 a 5 aos si tiene entre 18 y 39 aos. ? Todos los aos si es mayor de 40aos. Diabetes Realcese exmenes de deteccin de la diabetes con regularidad. Este anlisis revisa el nivel de azcar en la sangre en ayunas. Hgase las pruebas de deteccin:  Cada tresaos despus de los 40aos de edad si tiene un peso normal y un bajo riesgo de padecer diabetes.  Con ms frecuencia y a partir de una edad inferior si tiene sobrepeso o un alto riesgo de padecer diabetes. Qu debo saber sobre la prevencin de infecciones? Hepatitis B Si tiene un riesgo ms alto de contraer hepatitis B, debe someterse a un examen de deteccin de este virus. Hable con el mdico para averiguar si tiene riesgo de contraer la infeccin por hepatitis B. Hepatitis C Se recomienda el anlisis a:  Todos los que nacieron entre 1945 y 1965.  Todas las personas que tengan un riesgo de haber contrado hepatitis C. Enfermedades de transmisin sexual (ETS)  Hgase las   pruebas de deteccin de ITS, incluidas la gonorrea y la clamidia, si: ? Es sexualmente activa y es menor de 24aos. ? Es mayor de 24aos, y el mdico le informa que corre riesgo de tener este tipo de infecciones. ?  La actividad sexual ha cambiado desde que le hicieron la ltima prueba de deteccin y tiene un riesgo mayor de tener clamidia o gonorrea. Pregntele al mdico si usted tiene riesgo.  Pregntele al mdico si usted tiene un alto riesgo de contraer VIH. El mdico tambin puede recomendarle un medicamento recetado para ayudar a evitar la infeccin por el VIH. Si elige tomar medicamentos para prevenir el VIH, primero debe hacerse los anlisis de deteccin del VIH. Luego debe hacerse anlisis cada 3meses mientras est tomando los medicamentos. Embarazo  Si est por dejar de menstruar (fase premenopusica) y usted puede quedar embarazada, busque asesoramiento antes de quedar embarazada.  Tome de 400 a 800microgramos (mcg) de cido flico todos los das si queda embarazada.  Pida mtodos de control de la natalidad (anticonceptivos) si desea evitar un embarazo no deseado. Osteoporosis y menopausia La osteoporosis es una enfermedad en la que los huesos pierden los minerales y la fuerza por el avance de la edad. El resultado pueden ser fracturas en los huesos. Si tiene 65aos o ms, o si est en riesgo de sufrir osteoporosis y fracturas, pregunte a su mdico si debe:  Hacerse pruebas de deteccin de prdida sea.  Tomar un suplemento de calcio o de vitamina D para reducir el riesgo de fracturas.  Recibir terapia de reemplazo hormonal (TRH) para tratar los sntomas de la menopausia. Siga estas instrucciones en su casa: Estilo de vida  No consuma ningn producto que contenga nicotina o tabaco, como cigarrillos, cigarrillos electrnicos y tabaco de mascar. Si necesita ayuda para dejar de fumar, consulte al mdico.  No consuma drogas.  No comparta agujas.  Solicite ayuda a su mdico si necesita apoyo o informacin para abandonar las drogas. Consumo de alcohol  No beba alcohol si: ? Su mdico le indica no hacerlo. ? Est embarazada, puede estar embarazada o est tratando de quedar embarazada.   Si bebe alcohol: ? Limite la cantidad que consume de 0 a 1 medida por da. ? Limite la ingesta si est amamantando.  Est atento a la cantidad de alcohol que hay en las bebidas que toma. En los [place], una medida equivale a una botella de cerveza de 12oz (355ml), un vaso de vino de 5oz (148ml) o un vaso de una bebida alcohlica de alta graduacin de 1oz (44ml). Instrucciones generales  Realcese los estudios de rutina de la salud, dentales y de la vista.  Mantngase al da con las vacunas.  Infrmele a su mdico si: ? Se siente deprimida con frecuencia. ? Alguna vez ha sido vctima de maltrato o no se siente segura en su casa. Resumen  Adoptar un estilo de vida saludable y recibir atencin preventiva son importantes para promover la salud y el bienestar.  Siga las instrucciones del mdico acerca de una dieta saludable, el ejercicio y la realizacin de pruebas o exmenes para detectar enfermedades.  Siga las instrucciones del mdico con respecto al control del colesterol y la presin arterial. Esta informacin no tiene como fin reemplazar el consejo del mdico. Asegrese de hacerle al mdico cualquier pregunta que tenga. Document Released: 10/29/2011 Document Revised: 11/30/2018 Document Reviewed: 11/30/2018 Elsevier Patient Education  2020 [employer]  

## 2019-06-02 ENCOUNTER — Ambulatory Visit (INDEPENDENT_AMBULATORY_CARE_PROVIDER_SITE_OTHER): Payer: Medicare Other | Admitting: Internal Medicine

## 2019-06-02 VITALS — BP 118/62 | HR 89 | Wt 175.6 lb

## 2019-06-02 DIAGNOSIS — E782 Mixed hyperlipidemia: Secondary | ICD-10-CM | POA: Diagnosis not present

## 2019-06-02 DIAGNOSIS — I10 Essential (primary) hypertension: Secondary | ICD-10-CM | POA: Diagnosis not present

## 2019-06-02 DIAGNOSIS — E1159 Type 2 diabetes mellitus with other circulatory complications: Secondary | ICD-10-CM | POA: Diagnosis not present

## 2019-06-02 DIAGNOSIS — I2510 Atherosclerotic heart disease of native coronary artery without angina pectoris: Secondary | ICD-10-CM | POA: Diagnosis not present

## 2019-06-02 LAB — THYROID PANEL WITH TSH
Free Thyroxine Index: 1.8 (ref 1.2–4.9)
T3 Uptake Ratio: 23 % — ABNORMAL LOW (ref 24–39)
T4, Total: 8 ug/dL (ref 4.5–12.0)
TSH: 3.54 u[IU]/mL (ref 0.450–4.500)

## 2019-06-02 LAB — COMPREHENSIVE METABOLIC PANEL
ALT: 36 IU/L — ABNORMAL HIGH (ref 0–32)
AST: 29 IU/L (ref 0–40)
Albumin/Globulin Ratio: 1.7 (ref 1.2–2.2)
Albumin: 4.5 g/dL (ref 3.8–4.8)
Alkaline Phosphatase: 80 IU/L (ref 39–117)
BUN/Creatinine Ratio: 15 (ref 12–28)
BUN: 15 mg/dL (ref 8–27)
Bilirubin Total: <0.2 mg/dL (ref 0.0–1.2)
CO2: 23 mmol/L (ref 20–29)
Calcium: 9.8 mg/dL (ref 8.7–10.3)
Chloride: 102 mmol/L (ref 96–106)
Creatinine, Ser: 0.97 mg/dL (ref 0.57–1.00)
GFR calc Af Amer: 68 mL/min/{1.73_m2} (ref >59)
GFR calc non Af Amer: 59 mL/min/{1.73_m2} — ABNORMAL LOW (ref >59)
Globulin, Total: 2.7 g/dL (ref 1.5–4.5)
Glucose: 108 mg/dL — ABNORMAL HIGH (ref 65–99)
Potassium: 5.2 mmol/L (ref 3.5–5.2)
Sodium: 140 mmol/L (ref 134–144)
Total Protein: 7.2 g/dL (ref 6.0–8.5)

## 2019-06-02 LAB — HEMOGLOBIN A1C
Est. average glucose Bld gHb Est-mCnc: 151 mg/dL
Hgb A1c MFr Bld: 6.9 % — ABNORMAL HIGH (ref 4.8–5.6)

## 2019-06-02 NOTE — Progress Notes (Signed)
Cardiology Office Note:    Date:  06/02/2019   ID:  Erin Estes, DOB [birth year], MRN [id number]  PCP:  Erin Jun, FNP  Cardiologist:  No primary care provider on file.  Electrophysiologist:  None   Referring MD: Erin Jun, FNP   Chief Complaint: establish care for CAD and HTN.  History of Present Illness:    Erin Estes is a 73 y.o. female with a hx of hypertension, diabetes, and hyperlipidemia. She presents today to establish care for CAD and HTN, and for discussion of cardiovascular risk reduction. Visit completed    In 2011 - 2 stents placed while living in [place] for chest pain, tells me she did not have an MI. She was going to have bypass surgery but they placed stents instead. In 2012 - one more stent placed while living in [place].  Her primary physical activity is cleaning the house and walking. She will get some mild chest pain with activity, which she feels occurred 3 mo ago. Lately, she has not noticed much chest pain. She has not taken nitro for chest pain in the recent past.   The patient denies dyspnea at rest or with exertion, palpitations, PND, orthopnea, or leg swelling. Denies syncope or presyncope. Denies dizziness or lightheadedness. Denies snoring and has not been evaluated for sleep apnea.  Past Medical History:  Diagnosis Date   CAD (coronary artery disease)    Diabetes mellitus without complication ([place])    Hyperlipidemia    Hypertension     Past Surgical History:  Procedure Laterality Date   CORONARY ANGIOPLASTY N/A    HEMORRHOID SURGERY     KNEE SURGERY Left     Current Medications: Current Meds  Medication Sig   amLODipine (NORVASC) 10 MG tablet Take 1 tablet (10 mg total) by mouth daily.   atorvastatin (LIPITOR) 80 MG tablet Take 1 tablet (80 mg total) by mouth daily.   blood glucose meter kit and supplies Dispense based on patient and insurance preference. Use up to four times daily as directed. (FOR ICD-10  E10.9, E11.9).   clopidogrel (PLAVIX) 75 MG tablet Take 1 tablet (75 mg total) by mouth daily.   isosorbide mononitrate (IMDUR) 60 MG 24 hr tablet Take 1 tablet (60 mg total) by mouth daily.   lisinopril (PRINIVIL,ZESTRIL) 40 MG tablet Take 1 tablet (40 mg total) by mouth daily.   metFORMIN (GLUCOPHAGE) 500 MG tablet Take 2 tablets by mouth in the with breakfast and take 1 tablet by mouth with dinner.   [DISCONTINUED] metoprolol succinate (TOPROL-XL) 25 MG 24 hr tablet Take 1 tablet (25 mg total) by mouth daily. Take with or immediately following a meal.     Allergies:   Patient has no known allergies.   Social History   Socioeconomic History   Marital status: Significant Other    Spouse name: Not on file   Number of children: Not on file   Years of education: Not on file   Highest education level: Not on file  Occupational History   Not on file  Social Needs   Financial resource strain: Not on file   Food insecurity    Worry: Not on file    Inability: Not on file   Transportation needs    Medical: Not on file    Non-medical: Not on file  Tobacco Use   Smoking status: Never Smoker   Smokeless tobacco: Never Used  Substance and Sexual Activity   Alcohol use: Not Currently  Frequency: Never   Drug use: Never   Sexual activity: Not on file  Lifestyle   Physical activity    Days per week: Not on file    Minutes per session: Not on file   Stress: Not on file  Relationships   Social connections    Talks on phone: Not on file    Gets together: Not on file    Attends religious service: Not on file    Active member of club or organization: Not on file    Attends meetings of clubs or organizations: Not on file    Relationship status: Not on file  Other Topics Concern   Not on file  Social History Narrative   ** Merged History Encounter **         Family History: The patient's family history includes Hypertension in her sister and sister. There is  no history of Stroke.  ROS:   Please see the history of present illness.    All other systems reviewed and are negative.  EKGs/Labs/Other Studies Reviewed:    The following studies were reviewed today:  EKG:  06/01/2019 - NSR  Recent Labs: 12/09/2018: Hemoglobin 11.1; Platelets 257 06/01/2019: ALT 36; BUN 15; Creatinine, Ser 0.97; Potassium 5.2; Sodium 140; TSH 3.540  Recent Lipid Panel    Component Value Date/Time   CHOL 145 12/09/2018 1019   TRIG 99 12/09/2018 1019   HDL 40 12/09/2018 1019   CHOLHDL 3.6 12/09/2018 1019   CHOLHDL 7.6 Ratio 03/14/2009 2319   VLDL 32 03/14/2009 2319   LDLCALC 85 12/09/2018 1019    Physical Exam:    VS:  BP 118/62    Pulse 89    Wt 175 lb 9.6 oz (79.7 kg)    SpO2 96%    BMI 30.62 kg/m     Wt Readings from Last 5 Encounters:  06/02/19 175 lb 9.6 oz (79.7 kg)  06/01/19 173 lb (78.5 kg)  12/14/18 178 lb 3.2 oz (80.8 kg)  12/09/18 177 lb 12.8 oz (80.6 kg)     Constitutional: No acute distress Eyes: sclera non-icteric, normal conjunctiva and lids ENMT: normal dentition, moist mucous membranes Cardiovascular: regular rhythm, normal rate, no murmurs. S1 and S2 normal. Radial pulses normal bilaterally. No jugular venous distention.  Respiratory: clear to auscultation bilaterally GI : normal bowel sounds, soft and nontender. No distention.   MSK: extremities warm, well perfused. No edema.  NEURO: grossly nonfocal exam, moves all extremities. PSYCH: alert and oriented x 3, normal mood and affect.   ASSESSMENT:    1. CAD, multiple vessel, 2 stent    2. Essential hypertension   3. Mixed hyperlipidemia   4. Type 2 diabetes mellitus with other circulatory complication, without long-term current use of insulin (HCC)    PLAN:    CAD - For a history of CAD with stents, she has been continued on aspirin and plavix. Continue Imdur, atorvastatin, metoprolol.  Chest pain - no current chest pain. I have offered the patient a stress test but she would  like to observe at this time. Consider stress test if patient has recurrent chest pain.  Snores at night - I have offered the patient a sleep referral, will pursue when patient is ready.   HTN - stable, continue lisinopril and metoprolol  HLD - optimized, continue atorvastatin.   TIME SPENT WITH PATIENT: 45 minutes of direct patient care. More than 50% of that time was spent on coordination of care and counseling regarding CAD, cp and  evaluation.  Cherlynn Kaiser, MD [place]   [facility]   Medication Adjustments/Labs and Tests Ordered: Current medicines are reviewed at length with the patient today.  Concerns regarding medicines are outlined above.  No orders of the defined types were placed in this encounter.  No orders of the defined types were placed in this encounter.   Patient Instructions  Medication Instructions:    no changes  ,continue current medications   Call  The office to give [place] the name of medication   [phone]    If you need a refill on your cardiac medications before your next appointment, please call your pharmacy.   Lab work:   Not needed  Testing/Procedures: Not needed  Follow-Up: At [facility], you and your health needs are our priority.  As part of our continuing mission to provide you with exceptional heart care, we have created designated Provider Care Teams.  These Care Teams include your primary Cardiologist (physician) and Advanced Practice Providers (APPs -  Physician Assistants and Nurse Practitioners) who all work together to provide you with the care you need, when you need it.  You will need a follow up appointment in 3 months.  Please call our office 2 months in advance to schedule this appointment.  You may see  one of the following Advanced Practice Providers on your designated Care Team:    Rosaria Ferries, PA-C  Jory Sims, DNP, ANP  Any Other Special Instructions Will Be Listed Below (If Applicable).

## 2019-06-02 NOTE — Patient Instructions (Addendum)
Medication Instructions:    no changes  ,continue current medications   Call  The office to give [place] the name of medication   [phone]    If you need a refill on your cardiac medications before your next appointment, please call your pharmacy.   Lab work:   Not needed  Testing/Procedures: Not needed  Follow-Up: At [facility], you and your health needs are our priority.  As part of our continuing mission to provide you with exceptional heart care, we have created designated Provider Care Teams.  These Care Teams include your primary Cardiologist (physician) and Advanced Practice Providers (APPs -  Physician Assistants and Nurse Practitioners) who all work together to provide you with the care you need, when you need it. . You will need a follow up appointment in 3 months.  Please call our office 2 months in advance to schedule this appointment.  You may see  one of the following Advanced Practice Providers on your designated Care Team:   . Rosaria Ferries, PA-C . Jory Sims, DNP, ANP  Any Other Special Instructions Will Be Listed Below (If Applicable).

## 2019-06-06 NOTE — Progress Notes (Signed)
Patient ID: [name], female    DOB: Dec 12, 1945, 73 y.o.   MRN: [id number]  PCP: Scot Jun, FNP  Chief Complaint  Patient presents with  . Diabetes  . Hypertension  . Hyperlipidemia   Spanish interpreter used to facilitate care during today's visit.   Subjective:  HPI  Erin Estes is a 73 y.o. female presents for evaluation hypertension, diabetes, and hyperlipidemia.  Erin Estes has HYPERLIPIDEMIA; HYPERTENSION; SCIATICA; Diabetes mellitus ([place]); Essential hypertension; Mixed hyperlipidemia; and CAD, multiple vessel, 2 stent  on their problem list.   Hypertension History of CAD with stent placement, does not routinely monitor her blood pressure at home. She is hypotensive today and reports feeling completely fine. She has fasted today and reports not taking any medication today. She is not dizzy or weak. Prior readings in the office have been elevated. She endorse improved eating habits. No routine physical activity. Current Body mass index is 30.16 kg/m.  Diabetes Last A1C 7.1. She is currently prescribed metformin 1000 mg twice daily. No recent insulin use. Doesn't monitor blood sugar at home as she is uncertain of how to use meter. Her daughter checks on her multiple times per week and will be able to show her how utilize a glucometer. Denies polyuria, polyphagia, and   Hyperlipidemia  Currently prescribed high dose statin therapy. Prior lipid panel 6 months ago normal.    Social History   Socioeconomic History  . Marital status: Significant Other    Spouse name: Not on file  . Number of children: Not on file  . Years of education: Not on file  . Highest education level: Not on file  Occupational History  . Not on file  Social Needs  . Financial resource strain: Not on file  . Food insecurity    Worry: Not on file    Inability: Not on file  . Transportation needs    Medical: Not on file    Non-medical: Not on file  Tobacco Use  . Smoking status:  Never Smoker  . Smokeless tobacco: Never Used  Substance and Sexual Activity  . Alcohol use: Not Currently    Frequency: Never  . Drug use: Never  . Sexual activity: Not on file  Lifestyle  . Physical activity    Days per week: Not on file    Minutes per session: Not on file  . Stress: Not on file  Relationships  . Social Herbalist on phone: Not on file    Gets together: Not on file    Attends religious service: Not on file    Active member of club or organization: Not on file    Attends meetings of clubs or organizations: Not on file    Relationship status: Not on file  . Intimate partner violence    Fear of current or ex partner: Not on file    Emotionally abused: Not on file    Physically abused: Not on file    Forced sexual activity: Not on file  Other Topics Concern  . Not on file  Social History Narrative   ** Merged History Encounter **        Family History  Problem Relation Age of Onset  . Hypertension Sister   . Hypertension Sister   . Stroke Neg Hx      Review of Systems Pertinent negatives listed in HPI No Known Allergies  Prior to Admission medications   Medication Sig Start Date End Date Taking? Authorizing Provider  amLODipine (NORVASC) 10 MG tablet Take 1 tablet (10 mg total) by mouth daily. 06/01/19  Yes Scot Jun, FNP  atorvastatin (LIPITOR) 80 MG tablet Take 1 tablet (80 mg total) by mouth daily. 01/04/19  Yes Scot Jun, FNP  clopidogrel (PLAVIX) 75 MG tablet Take 1 tablet (75 mg total) by mouth daily. 01/04/19  Yes Scot Jun, FNP  isosorbide mononitrate (IMDUR) 60 MG 24 hr tablet Take 1 tablet (60 mg total) by mouth daily. 01/04/19  Yes Scot Jun, FNP  lisinopril (PRINIVIL,ZESTRIL) 40 MG tablet Take 1 tablet (40 mg total) by mouth daily. 01/04/19  Yes Scot Jun, FNP  metFORMIN (GLUCOPHAGE) 500 MG tablet Take 2 tablets by mouth in the with breakfast and take 1 tablet by mouth with dinner. 06/01/19   Yes Scot Jun, FNP  blood glucose meter kit and supplies Dispense based on patient and insurance preference. Use up to four times daily as directed. (FOR ICD-10 E10.9, E11.9). 06/01/19   Scot Jun, FNP    Past Medical, Surgical Family and Social History reviewed and updated.    Objective:   Today's Vitals   06/01/19 1349  BP: (!) 91/53  Pulse: 64  Resp: 17  Temp: (!) 97.2 F (36.2 C)  TempSrc: Temporal  SpO2: 95%  Weight: 173 lb (78.5 kg)  Height: 5' 3.5" (1.613 m)    BP Readings from Last 3 Encounters:  06/02/19 118/62  06/01/19 (!) 91/53  01/04/19 (!) 182/91    Filed Weights   06/01/19 1349  Weight: 173 lb (78.5 kg)       Physical Exam General appearance: alert, well developed, well nourished, cooperative and in no distress Head: Normocephalic, without obvious abnormality, atraumatic Respiratory: Respirations even and unlabored, normal respiratory rate Heart: rate and rhythm normal. No gallop or murmurs noted on exam  Abdomen: BS +, no distention, no rebound tenderness, or no mass Extremities: No gross deformities Skin: Skin color, texture, turgor normal. No rashes seen  Psych: Appropriate mood and affect. Neurologic: Mental status: Alert, oriented to person, place, and time, thought content appropriate.  Lab Results  Component Value Date   POCGLU 203 (A) 12/14/2018   POCGLU 105 (A) 12/09/2018    Lab Results  Component Value Date   HGBA1C 6.9 (H) 06/01/2019    Assessment & Plan:  1. CAD, multiple vessel, 2 stent , asymptomatic  - Ambulatory referral to Cardiology  2. Essential hypertension, very low BP today - Ambulatory referral to Cardiology, CAD follow-up  - Comprehensive metabolic panel  3. Mixed hyperlipidemia -Continue Atorvastatin 80 mg once daily    4. Type 2 diabetes mellitus with other circulatory complication, without long-term current use of insulin (HCC) - Hemoglobin A1C, last A1C 7.1. Repeating today. Provided an order  for glucometer.   5. Hypotension, unspecified hypotension type - EKG 12-Lead, normal -Patient is scheduled for follow-up with   6. Screening for thyroid disorder - Thyroid Panel With TSH  7. Screening for breast cancer - Ambulatory referral to Breast Clinic, unable to order test due to medicare restrictions. Sent a referral as patient may qualify for BCCEP program.    Meds ordered this encounter  Medications  . metFORMIN (GLUCOPHAGE) 500 MG tablet    Sig: Take 2 tablets by mouth in the with breakfast and take 1 tablet by mouth with dinner.    Dispense:  270 tablet    Refill:  3    Patient will notify you once refills are needed.  Marland Kitchen  DISCONTD: metoprolol succinate (TOPROL-XL) 25 MG 24 hr tablet    Sig: Take 1 tablet (25 mg total) by mouth daily. Take with or immediately following a meal.    Dispense:  30 tablet    Refill:  3  . amLODipine (NORVASC) 10 MG tablet    Sig: Take 1 tablet (10 mg total) by mouth daily.    Dispense:  30 tablet    Refill:  3  . blood glucose meter kit and supplies    Sig: Dispense based on patient and insurance preference. Use up to four times daily as directed. (FOR ICD-10 E10.9, E11.9).    Dispense:  1 each    Refill:  0    Order Specific Question:   Number of strips    Answer:   200    Order Specific Question:   Number of lancets    Answer:   [place], FNP Primary Care at [facility] [address] [place] [phone]: [phone]

## 2019-06-08 ENCOUNTER — Ambulatory Visit: Payer: Medicare Other

## 2019-06-20 ENCOUNTER — Other Ambulatory Visit: Payer: Self-pay | Admitting: Family Medicine

## 2019-07-03 ENCOUNTER — Telehealth: Payer: Self-pay

## 2019-07-03 NOTE — Telephone Encounter (Signed)
Called patient to do their pre-visit COVID screening using [employer]([place], [phone])  Have you been tested for COVID or are you currently waiting for COVID test results? no  Have you recently traveled internationally([place], [place], [place], [place], [place]) or within the [place] to a hotspot area([place], [place], [place], [place], [place])? no  Are you currently experiencing any of the following: fever, cough, SHOB, fatigue, body aches, loss of smell, rash, diarrhea, vomiting, severe headaches, weakness, sore throat? no  Have you been in contact with anyone who has recently travelled? no  Have you been in contact with anyone who is experiencing any of the above symptoms or been diagnosed with COVID  or works in or has recently visited a SNF? no

## 2019-07-04 ENCOUNTER — Ambulatory Visit (INDEPENDENT_AMBULATORY_CARE_PROVIDER_SITE_OTHER): Payer: Medicare Other | Admitting: Internal Medicine

## 2019-07-04 ENCOUNTER — Encounter: Payer: Self-pay | Admitting: Internal Medicine

## 2019-07-04 ENCOUNTER — Other Ambulatory Visit: Payer: Self-pay

## 2019-07-04 VITALS — BP 93/61 | HR 90 | Temp 97.3°F | Resp 17 | Ht 63.5 in | Wt 175.6 lb

## 2019-07-04 DIAGNOSIS — M5416 Radiculopathy, lumbar region: Secondary | ICD-10-CM

## 2019-07-04 DIAGNOSIS — I2510 Atherosclerotic heart disease of native coronary artery without angina pectoris: Secondary | ICD-10-CM | POA: Diagnosis not present

## 2019-07-04 DIAGNOSIS — E1159 Type 2 diabetes mellitus with other circulatory complications: Secondary | ICD-10-CM | POA: Diagnosis not present

## 2019-07-04 DIAGNOSIS — I959 Hypotension, unspecified: Secondary | ICD-10-CM | POA: Diagnosis not present

## 2019-07-04 DIAGNOSIS — Z23 Encounter for immunization: Secondary | ICD-10-CM

## 2019-07-04 MED ORDER — TRAMADOL HCL 50 MG PO TABS: 50.0000 mg | ORAL_TABLET | Freq: Three times a day (TID) | ORAL | 0 refills | Status: DC | PRN

## 2019-07-04 MED ORDER — METOPROLOL SUCCINATE ER 25 MG PO TB24: 25.0000 mg | ORAL_TABLET | Freq: Every day | ORAL | 3 refills | Status: DC

## 2019-07-04 MED ORDER — AMLODIPINE BESYLATE 5 MG PO TABS: 5.0000 mg | ORAL_TABLET | Freq: Every day | ORAL | 6 refills | Status: DC

## 2019-07-04 NOTE — Patient Instructions (Signed)
Citica Sciatica  La citica es el dolor, debilidad, hormigueo o prdida de la sensibilidad (adormecimiento) a lo largo del nervio citico. El nervio citico comienza en la parte inferior de la espalda y desciende por la parte posterior de cada pierna. Suele desaparecer por s sola o con tratamiento. A veces, la citica puede volver a aparecer (ser recurrente). Cules son las causas? Esta afeccin se produce cuando el nervio citico se comprime o se ejerce presin sobre l. Esto puede ser el resultado de:  Un disco que sobresale demasiado entre los huesos de la columna vertebral (hernia de disco).  Los cambios que se producen en los discos vertebrales al envejecer.  Una afeccin en un msculo de las nalgas.  Un crecimiento seo adicional cerca del nervio citico.  Una rotura (fractura) de la zona que est entre los huesos de la cadera (pelvis).  Embarazo.  Tumor. Esto es poco frecuente. Qu incrementa el riesgo? Es ms probable que tengan esta afeccin las personas que:  Practican deportes que ponen presin o tensin sobre la columna vertebral.  Tienen poca fuerza y facilidad de movimiento (flexibilidad).  Han tenido una lesin en la espalda en el pasado.  Han tenido una ciruga en la espalda.  Permanecen sentadas durante largos perodos.  Realizan actividades que implican agacharse o levantar objetos una y otra vez.  Tienen mucho sobrepeso (es obeso). Cules son los signos o los sntomas? Los sntomas pueden variar de leves a muy graves. Pueden incluir los siguientes:  Cualquiera de los siguientes problemas en la parte inferior de la espalda, piernas, cadera o nalgas: ? Hormigueo leve, prdida de la sensibilidad o dolor sordo. ? Sensacin de ardor. ? Dolor agudo.  Prdida de la sensibilidad en la parte posterior de la pantorrilla o la planta del pie.  Debilidad en las piernas.  Dolor muy intenso en la espalda que dificulta el movimiento. Estos sntomas pueden  empeorar al toser, estornudar o rer. Tambin pueden empeorar al sentarse o estar de pie durante largos perodos. Cmo se trata? A menudo, esta afeccin mejora sin tratamiento. Sin embargo, el tratamiento puede incluir:  Cambiar de actividad fsica o reducirla cuando siente dolor.  Hacer ejercicios y estiramientos.  Aplicar hielo o calor sobre la zona afectada.  Medicamentos para lo siguiente: ? Aliviar el dolor y la inflamacin. ? Relajar los msculos.  Inyecciones de medicamentos que ayudan a aliviar el dolor, la irritacin y la hinchazn.  Ciruga. Siga estas instrucciones en su casa: Medicamentos  Tome los medicamentos de venta libre y los recetados solamente como se lo haya indicado el mdico.  Consulte a su mdico si el medicamento que le recetaron: ? Hace que sea necesario que evite conducir o usar maquinaria pesada. ? Puede causarle dificultad para defecar (estreimiento). Es posible que deba tomar estas medidas para prevenir o tratar los problemas para defecar:  Beber suficiente lquido para mantener el pis (la orina) de color amarillo plido.  Tomar medicamentos recetados o de venta libre.  Comer alimentos ricos en fibra. Entre ellos, frijoles, cereales integrales y frutas y verduras frescas.  Limitar los alimentos con alto contenido de grasa y azcar. Estos incluyen alimentos fritos o dulces. Control del dolor      Si se lo indican, aplique hielo en la zona afectada. ? Ponga el hielo en una bolsa plstica. ? Coloque una toalla entre la piel y la bolsa. ? Coloque el hielo durante 20minutos, 2 a 3veces por da.  Si se lo indican, aplique calor en la zona afectada.   Use la fuente de calor que el mdico le indique, por ejemplo, una compresa de calor hmedo o una almohadilla trmica. ? Coloque una toalla entre la piel y la fuente de calor. ? Aplique calor durante 20 a 30minutos. ? Retire la fuente de calor si la piel se pone de color rojo brillante. Esto es muy  importante si no puede sentir dolor, calor o fro. Puede correr un riesgo mayor de sufrir quemaduras. Actividad   Retome sus actividades habituales como se lo haya indicado el mdico. Pregntele al mdico qu actividades son seguras para usted.  Evite las actividades que empeoran los sntomas.  Descanse por breves perodos durante el da. ? Cuando descanse durante perodos ms largos, haga alguna actividad fsica o un estiramiento entre los perodos de descanso. ? Evite estar sentado durante largos perodos sin moverse. Levntese y muvase al menos una vez cada hora.  Haga ejercicios y estrese con regularidad, como se lo indic el mdico.  No levante nada que pese ms de 10libras (4.5kg) mientras tenga sntomas de citica. ? Aunque no tenga sntomas, evite levantar objetos pesados. ? Evite levantar objetos pesados de forma repetida.  Al levantar objetos, hgalo siempre de una forma que sea segura para su cuerpo. Para esto, debe hacer lo siguiente: ? Flexione las rodillas. ? Mantenga el objeto cerca del cuerpo. ? No gire el cuerpo. Instrucciones generales  Mantenga un peso saludable.  Use calzado cmodo, que le d soporte al pie. Evite usar tacones.  Evite dormir sobre un colchn que sea demasiado blando o demasiado duro. Es posible que sienta menos dolor si duerme en un colchn con apoyo suficientemente firme para la espalda.  Concurra a todas las visitas de seguimiento como se lo haya indicado el mdico. Esto es importante. Comunquese con un mdico si:  Tiene un dolor con estas caractersticas: ? Lo despierta cuando est dormido. ? Empeora al estar recostado. ? Es peor que el dolor que tena en el pasado. ? Dura ms de 4semanas.  Pierde peso sin proponrselo. Solicite ayuda inmediatamente si:  No puede controlar la orina (miccin) ni la evacuacin de la materia fecal (defecacin).  Tiene debilidad en alguna de estas zonas, y la debilidad empeora: ? La parte inferior  de la espalda. ? La zona que se encuentra entre los huesos de las caderas. ? Las nalgas. ? Las piernas.  Siente irritacin o inflamacin en la espalda.  Tiene sensacin de ardor al orinar. Resumen  La citica es el dolor, debilidad, hormigueo o prdida de la sensibilidad (adormecimiento) a lo largo del nervio citico.  Esta afeccin se produce cuando el nervio citico se comprime o se ejerce presin sobre l.  La citica puede causar dolor, hormigueo o prdida de la sensibilidad (adormecimiento) en la parte inferior de la espalda, las piernas, las caderas y las nalgas.  El tratamiento a menudo incluye reposo, ejercicio, medicamentos y aplicar hielo o calor en la zona afectada. Esta informacin no tiene como fin reemplazar el consejo del mdico. Asegrese de hacerle al mdico cualquier pregunta que tenga. Document Released: 12/12/2010 Document Revised: 01/04/2019 Document Reviewed: 01/04/2019 Elsevier Patient Education  2020 [employer]  

## 2019-07-04 NOTE — Progress Notes (Signed)
Patient ID: Erin Estes, female    DOB: Jun 25, 1946  MRN: [id number]  CC: Hypertension   Subjective: Erin Estes is a 73 y.o. female who presents for chronic disease management.  Last seen by NP Molli Barrows 1 month ago Her concerns today include:  Patient with history of HTN, CAD with history of 2 stent placement, DM, HL  On last visit with NP, patient blood pressure was noted to be low but patient was asymptomatic.  Metoprolol dose was decreased from 100 mg to 25 mg daily.  She was referred to cardiology whom she saw 06/02/2019.  Blood pressure on that visit was normal and no changes were made.  Purpose of today's visit was to recheck BP -checks BP 1-2 x a wk.  Brings log but she has only been recording the SBP:  130, 137, 107, 133. -has med bottles with her.  Out of Metoprolol 25 mg XL x 2 day -No dizziness, fatigue, CP, SOB  DM:  Last eye exam was 3-4 yrs ago.  Had cataract extraction BL with lens implant at the time.  Recent A1C less than 7. Compliant with Metformin.   -checks BS once a week.  Has log.  Range 110-191  C/o pain in LT lower back that radiates down the leg x 5 days. Started after she lifted a wall painting.  Reports she has hx of sciatica and "two disc on my column being out of place."  When she gets flare of sciatica, it is always on LT side and occurs when she over does it. No incontinence of bowel/bladder No weakness in leg   Patient Active Problem List   Diagnosis Date Noted  . Diabetes mellitus ([place]) 12/13/2018  . Essential hypertension 12/13/2018  . Mixed hyperlipidemia 12/13/2018  . CAD, multiple vessel, 2 stent  12/13/2018  . HYPERLIPIDEMIA 03/14/2009  . HYPERTENSION 03/14/2009  . SCIATICA 03/14/2009     Current Outpatient Medications on File Prior to Visit  Medication Sig Dispense Refill  . Accu-Chek FastClix Lancets MISC USE TO TEST BLOOD SUGAR UP TO 4 TIMES DAILY AS DIRECTED    . ACCU-CHEK GUIDE test strip USE TO TEST BLOOD SUGAR UP TO 4  TIMES DAILY AS DIRECTED    . aspirin 81 MG chewable tablet Chew 81 mg by mouth daily.    Marland Kitchen atorvastatin (LIPITOR) 80 MG tablet Take 1 tablet (80 mg total) by mouth daily. 90 tablet 1  . clopidogrel (PLAVIX) 75 MG tablet Take 1 tablet (75 mg total) by mouth daily. 90 tablet 1  . isosorbide mononitrate (IMDUR) 60 MG 24 hr tablet Take 1 tablet (60 mg total) by mouth daily. 90 tablet 1  . lisinopril (PRINIVIL,ZESTRIL) 40 MG tablet Take 1 tablet (40 mg total) by mouth daily. 90 tablet 1  . metFORMIN (GLUCOPHAGE) 500 MG tablet Take 2 tablets by mouth in the with breakfast and take 1 tablet by mouth with dinner. 270 tablet 3   No current facility-administered medications on file prior to visit.     No Known Allergies  Social History   Socioeconomic History  . Marital status: Significant Other    Spouse name: Not on file  . Number of children: Not on file  . Years of education: Not on file  . Highest education level: Not on file  Occupational History  . Not on file  Social Needs  . Financial resource strain: Not on file  . Food insecurity    Worry: Not on file  Inability: Not on file  . Transportation needs    Medical: Not on file    Non-medical: Not on file  Tobacco Use  . Smoking status: Never Smoker  . Smokeless tobacco: Never Used  Substance and Sexual Activity  . Alcohol use: Not Currently    Frequency: Never  . Drug use: Never  . Sexual activity: Not on file  Lifestyle  . Physical activity    Days per week: Not on file    Minutes per session: Not on file  . Stress: Not on file  Relationships  . Social Musicianconnections    Talks on phone: Not on file    Gets together: Not on file    Attends religious service: Not on file    Active member of club or organization: Not on file    Attends meetings of clubs or organizations: Not on file    Relationship status: Not on file  . Intimate partner violence    Fear of current or ex partner: Not on file    Emotionally abused: Not on  file    Physically abused: Not on file    Forced sexual activity: Not on file  Other Topics Concern  . Not on file  Social History Narrative   ** Merged History Encounter **        Family History  Problem Relation Age of Onset  . Hypertension Sister   . Hypertension Sister   . Stroke Neg Hx     Past Surgical History:  Procedure Laterality Date  . CORONARY ANGIOPLASTY N/A   . HEMORRHOID SURGERY    . KNEE SURGERY Left     ROS: Review of Systems Negative except as stated above  PHYSICAL EXAM: BP 93/61   Pulse 90   Temp (!) 97.3 F (36.3 C) (Temporal)   Resp 17   Ht 5' 3.5" (1.613 m)   Wt 175 lb 9.6 oz (79.7 kg)   SpO2 95%   BMI 30.62 kg/m   BP 86/54 Physical Exam  General appearance - alert, well appearing, and in no distress Mental status - normal mood, behavior, speech, dress, motor activity, and thought processes Chest - clear to auscultation, no wheezes, rales or rhonchi, symmetric air entry Heart - normal rate, regular rhythm, normal S1, S2, no murmurs, rubs, clicks or gallops Neurological -power in both lower extremities 5/5 bilaterally.  Gross sensation intact in both lower extremities. Musculoskeletal -mild tenderness on palpation of the lumbar spine.  Gait is normal Extremities -no lower extremity edema  Lab Results  Component Value Date   HGBA1C 6.9 (H) 06/01/2019    CMP Latest Ref Rng & Units 06/01/2019 12/09/2018 03/14/2009  Glucose 65 - 99 mg/dL 161(W108(H) 960(A104(H) 95  BUN 8 - 27 mg/dL 15 13 15   Creatinine 0.57 - 1.00 mg/dL 5.400.97 9.810.76 1.910.79  Sodium 134 - 144 mmol/L 140 143 142  Potassium 3.5 - 5.2 mmol/L 5.2 4.3 4.6  Chloride 96 - 106 mmol/L 102 103 107  CO2 20 - 29 mmol/L 23 24 20   Calcium 8.7 - 10.3 mg/dL 9.8 9.1 9.6  Total Protein 6.0 - 8.5 g/dL 7.2 7.5 -  Total Bilirubin 0.0 - 1.2 mg/dL <4.7<0.2 <8.2<0.2 -  Alkaline Phos 39 - 117 IU/L 80 89 -  AST 0 - 40 IU/L 29 35 -  ALT 0 - 32 IU/L 36(H) 48(H) -   Lipid Panel     Component Value Date/Time   CHOL  145 12/09/2018 1019   TRIG 99 12/09/2018  1019   HDL 40 12/09/2018 1019   CHOLHDL 3.6 12/09/2018 1019   CHOLHDL 7.6 Ratio 03/14/2009 2319   VLDL 32 03/14/2009 2319   LDLCALC 85 12/09/2018 1019    CBC    Component Value Date/Time   WBC 8.9 12/09/2018 1019   RBC 5.13 12/09/2018 1019   HGB 11.1 12/09/2018 1019   HCT 35.4 12/09/2018 1019   PLT 257 12/09/2018 1019   MCV 69 (L) 12/09/2018 1019   MCH 21.6 (L) 12/09/2018 1019   MCHC 31.4 (L) 12/09/2018 1019   RDW 15.4 12/09/2018 1019   LYMPHSABS 2.0 12/09/2018 1019   EOSABS 0.0 12/09/2018 1019   BASOSABS 0.0 12/09/2018 1019    ASSESSMENT AND PLAN: 1. Hypotension, unspecified hypotension type Home systolic blood pressure readings are okay but blood pressure readings are low here.  Patient did say that the DBP not recorded because her machine is not working properly so I question the accuracy of even the SBP readings.  She has been out of metoprolol for 2 days and blood pressure is low though she is asymptomatic.  I have refilled the metoprolol and recommend that we cut the dose of the Norvasc in half to 5 mg daily -Follow-up in 1 to 2 weeks with CMA for repeat blood pressure check  2. Type 2 diabetes mellitus with other circulatory complication, without long-term current use of insulin ([facility]CC) - Ambulatory referral to Ophthalmology  3. Lumbar radiculopathy Advised patient to avoid heavy lifting pushing and pulling.  Recommend use of heating pad to the lower back.  I have given a limited supply of tramadol to use as needed.  Patient informed that the medication can cause drowsiness.  [place] controlled substance reporting system reviewed. - traMADol (ULTRAM) 50 MG tablet; Take 1 tablet (50 mg total) by mouth every 8 (eight) hours as needed.  Dispense: 20 tablet; Refill: 0  4. Need for vaccination against Streptococcus pneumoniae using pneumococcal conjugate vaccine 13 Given - Pneumococcal conjugate vaccine 13-valent  5. Need for  Tdap vaccination Given - Tdap vaccine greater than or equal to 7yo IM    Patient was given the opportunity to ask questions.  Patient verbalized understanding of the plan and was able to repeat key elements of the plan.  [employer] interpreter used during this encounter. #[id number]   Orders Placed This Encounter  Procedures  . Tdap vaccine greater than or equal to 7yo IM  . Pneumococcal conjugate vaccine 13-valent  . Ambulatory referral to Ophthalmology     Requested Prescriptions   Signed Prescriptions Disp Refills  . traMADol (ULTRAM) 50 MG tablet 20 tablet 0    Sig: Take 1 tablet (50 mg total) by mouth every 8 (eight) hours as needed.  . metoprolol succinate (TOPROL-XL) 25 MG 24 hr tablet 90 tablet 3    Sig: Take 1 tablet (25 mg total) by mouth daily.  Marland Kitchen. amLODipine (NORVASC) 5 MG tablet 30 tablet 6    Sig: Take 1 tablet (5 mg total) by mouth daily.    Return in about 2 months (around 09/03/2019).  Jonah Blueeborah , MD, FACP

## 2019-07-18 ENCOUNTER — Ambulatory Visit: Payer: Medicare Other

## 2019-07-20 ENCOUNTER — Other Ambulatory Visit: Payer: Self-pay

## 2019-07-20 ENCOUNTER — Other Ambulatory Visit: Payer: Self-pay | Admitting: Family Medicine

## 2019-07-20 MED ORDER — ONETOUCH VERIO VI STRP: ORAL_STRIP | 1 refills | Status: AC

## 2019-07-20 MED ORDER — ONETOUCH ULTRASOFT LANCETS MISC: 1 refills | Status: AC

## 2019-07-20 MED ORDER — ONETOUCH VERIO W/DEVICE KIT: PACK | 0 refills | Status: AC

## 2019-09-01 ENCOUNTER — Telehealth: Payer: Self-pay

## 2019-09-01 NOTE — Telephone Encounter (Signed)
Called patient to do their pre-visit COVID screening.  Call went to voicemail. Unable to do prescreening.  

## 2019-09-04 ENCOUNTER — Other Ambulatory Visit: Payer: Self-pay

## 2019-09-04 ENCOUNTER — Ambulatory Visit (INDEPENDENT_AMBULATORY_CARE_PROVIDER_SITE_OTHER): Payer: Medicare Other | Admitting: Family Medicine

## 2019-09-04 ENCOUNTER — Encounter: Payer: Self-pay | Admitting: Family Medicine

## 2019-09-04 VITALS — BP 149/81 | HR 72 | Temp 97.0°F | Resp 17 | Wt 174.0 lb

## 2019-09-04 DIAGNOSIS — E782 Mixed hyperlipidemia: Secondary | ICD-10-CM | POA: Diagnosis not present

## 2019-09-04 DIAGNOSIS — M5416 Radiculopathy, lumbar region: Secondary | ICD-10-CM

## 2019-09-04 DIAGNOSIS — Z7902 Long term (current) use of antithrombotics/antiplatelets: Secondary | ICD-10-CM

## 2019-09-04 DIAGNOSIS — Z79899 Other long term (current) drug therapy: Secondary | ICD-10-CM | POA: Diagnosis not present

## 2019-09-04 DIAGNOSIS — R233 Spontaneous ecchymoses: Secondary | ICD-10-CM | POA: Diagnosis not present

## 2019-09-04 DIAGNOSIS — R0989 Other specified symptoms and signs involving the circulatory and respiratory systems: Secondary | ICD-10-CM

## 2019-09-04 DIAGNOSIS — I2510 Atherosclerotic heart disease of native coronary artery without angina pectoris: Secondary | ICD-10-CM | POA: Diagnosis not present

## 2019-09-04 DIAGNOSIS — T148XXA Other injury of unspecified body region, initial encounter: Secondary | ICD-10-CM

## 2019-09-04 DIAGNOSIS — E119 Type 2 diabetes mellitus without complications: Secondary | ICD-10-CM

## 2019-09-04 DIAGNOSIS — I10 Essential (primary) hypertension: Secondary | ICD-10-CM | POA: Diagnosis not present

## 2019-09-04 MED ORDER — ISOSORBIDE MONONITRATE ER 60 MG PO TB24: 60.0000 mg | ORAL_TABLET | Freq: Every day | ORAL | 3 refills | Status: DC

## 2019-09-04 MED ORDER — TRAMADOL HCL 50 MG PO TABS: 50.0000 mg | ORAL_TABLET | Freq: Three times a day (TID) | ORAL | 1 refills | Status: DC | PRN

## 2019-09-04 NOTE — Patient Instructions (Signed)
Gripe en los adultos Influenza, Adult A la gripe tambin se la conoce como "influenza". Es una infeccin en los pulmones, la nariz y la garganta (vas respiratorias). La causa un virus. La gripe provoca sntomas que son similares a los de un resfro. Tambin causa fiebre alta y dolores corporales. Se transmite fcilmente de persona a persona (es contagiosa). La mejor manera de prevenir la gripe es aplicndose la vacuna contra la gripe todos los aos. Cules son las causas? La causa de esta afeccin es el virus de la influenza. Puede contraer el virus de las siguientes maneras:  Respirar las gotitas que estn en el aire y que provienen de la tos o el estornudo de una persona que tiene el virus.  Tocar algo que tiene el virus (est contaminado) y luego tocarse la boca, la nariz o los ojos. Qu incrementa el riesgo? Hay ciertas cosas que lo pueden hacer ms propenso a tener gripe. Estas incluyen lo siguiente:  No lavarse las manos con frecuencia.  Tener contacto cercano con muchas personas durante la temporada de resfro y gripe.  Tocarse la boca, los ojos o la nariz sin antes lavarse las manos.  No recibir la vacuna antigripal todos los aos. Puede correr un mayor riesgo de tener gripe, junto con problemas graves como una infeccin pulmonar (neumona), si:  Es mayor de 65 aos de edad.  Est embarazada.  Tiene debilitado el sistema que combate las defensas (sistema inmunitario) debido a una enfermedad o porque toma determinados medicamentos.  Tiene una enfermedad prolongada (crnica), por ejemplo: ? Enfermedad cardaca, renal o pulmonar. ? Diabetes. ? Asma.  Tiene un trastorno heptico.  Tiene mucho sobrepeso (obesidad mrbida).  Tiene anemia. Esta es una afeccin que afecta a los glbulos rojos. Cules son los signos o los sntomas? Los sntomas normalmente comienzan de repente y duran entre 4 y 14 das. Pueden incluir los siguientes:  Fiebre y escalofros.  Dolores de  cabeza, dolores en el cuerpo o dolores musculares.  Dolor de garganta.  Tos.  Secrecin o congestin nasal.  Malestar en el pecho.  No desear comer en las cantidades normales (prdida del apetito).  Debilidad o cansancio (fatiga).  Mareos.  Malestar estomacal (nuseas) o ganas de devolver (vmitos). Cmo se trata? Si la gripe se encuentra de forma temprana, se la puede tratar con medicamentos que pueden ayudar a reducir la gravedad de la enfermedad y reducir su duracin (medicamentos antivirales). Estos pueden administrarse por boca (va oral) o por va (catter) intravenosa. Cuidarse en su hogar puede ayudar a que mejoren los sntomas. El mdico puede sugerirle lo siguiente:  Tomar medicamentos de venta libre.  Beber mucho lquido. La gripe suele desaparecer sola. Si tiene sntomas muy graves u otros problemas, puede recibir tratamiento en un hospital. Siga estas indicaciones en su casa:     Actividad  Descanse todo lo que sea necesario. Duerma lo suficiente.  Qudese en su casa y no concurra al trabajo o a la escuela, como se lo haya indicado el mdico. ? No salga de su casa hasta que no haya tenido fiebre por 24horas sin tomar medicamentos. ? Salga de su casa solo para ir al mdico. Comida y bebida  Tome una SRO (solucin de rehidratacin oral). Es una bebida que se vende en farmacias y tiendas.  Beba suficiente lquido para mantener el pis (la orina) de color amarillo plido.  En la medida en que pueda, beba lquidos claros en pequeas cantidades. Los lquidos transparentes son, por ejemplo: ? Agua. ? Trocitos   de hielo. ? Jugo de frutas con agua agregada (jugo de frutas diluido). ? Bebidas deportivas de bajas caloras.  En la medida en que pueda, consuma alimentos blandos y fciles de digerir en pequeas cantidades. Estos alimentos incluyen: ? Bananas. ? Pur de [employer]. ? Arroz. ? [employer]on. ? Tostadas. ? Galletas.  No coma ni beba lo  siguiente: ? Lquidos con alto contenido de azcar o cafena. ? Alcohol. ? Alimentos condimentados o con alto contenido de [place]. Indicaciones generales  [employer] de venta libre y los recetados solamente como se lo haya indicado el mdico.  Use un humidificador de aire fro para que el aire de su casa est ms hmedo. Esto puede facilitar la respiracin.  Al toser o estornudar, cbrase la boca y la [place].  Lvese las manos con agua y jabn frecuentemente, en especial despus de toser o Brewing technologist. Use desinfectante para manos con alcohol si no dispone de [place] y [place].  Concurra a todas las visitas de control como se lo haya indicado el mdico. Esto es importante. Cmo se evita?   Colquese la vacuna antigripal todos los [place]. Puede colocarse la vacuna contra la gripe a fines de verano, en otoo o en invierno. Pregntele al mdico cundo debe aplicarse la vacuna contra la gripe.  Evite el contacto con personas que estn enfermas durante el otoo y el invierno (la temporada de resfro y gripe). Comunquese con un mdico si:  Tiene sntomas nuevos.  Tiene los siguientes sntomas: ? Tourist information centre manager. ? Materia fecal lquida (diarrea). ? Fiebre.  La tos empeora.  Empieza a tener ms mucosidad.  Tiene Higher education careers adviser.  Vomita. Solicite ayuda inmediatamente si:  Le falta el aire.  Tiene dificultad para respirar.  La piel o las uas se ponen de un color azulado.  Presenta dolor muy intenso o rigidez en el cuello.  Tiene dolor de cabeza repentino.  Le duele la cara o el odo de forma repentina.  No puede comer ni beber sin vomitar. Resumen  La gripe es una infeccin en los pulmones, la nariz y Patent examiner. La causa un virus.  Tome los medicamentos de venta libre y los recetados solamente como se lo haya indicado el mdico.  Aplicarse la vacuna contra la gripe todos los aos es la mejor manera de evitar contagiarse la gripe. Esta informacin no tiene  Marine scientist el consejo del mdico. Asegrese de hacerle al mdico cualquier pregunta que tenga. Document Released: 02/05/2009 Document Revised: 06/22/2018 Document Reviewed: 06/22/2018 Elsevier Patient Education  2020 [employer].  COVID-19 COVID-19 La COVID-19 es una infeccin respiratoria causada por un virus llamado coronavirus tipo 2 causante del sndrome respiratorio agudo grave (SARS-CoV-2). La enfermedad tambin se conoce como enfermedad por coronavirus o nuevo coronavirus. En algunas personas, el virus puede no ocasionar sntomas. En otras, puede producir una infeccin grave. La infeccin puede empeorar rpidamente y causar complicaciones, como:  Neumona o infeccin en los pulmones.  Sndrome de dificultad respiratoria aguda o SDRA. Se trata de la acumulacin de lquido en los pulmones.  Insuficiencia respiratoria aguda. Se trata de una afeccin en la que no pasa suficiente oxgeno de los pulmones al cuerpo.  Sepsis o choque sptico. Se trata de una reaccin grave del cuerpo ante una infeccin.  Problemas de coagulacin.  Infecciones secundarias debido a bacterias u hongos. El virus que causa la COVID-19 es contagioso. Esto significa que puede transmitirse de [place] persona a otra a travs de las gotitas de saliva de la  tos y de los estornudos (secreciones respiratorias). Cules son las causas? Esta enfermedad es causada por un virus. Usted puede contagiarse con este virus:  Al aspirar las gotitas que una persona infectada elimina al toser o Engineering geologistestornudar.  Al tocar algo, como una mesa o el picaportes de Coal Centeruna puerta, que estuvo expuesto al virus (contaminado) y luego tocarse la boca, nariz o los ojos. Qu incrementa el riesgo? Riesgo de infeccin Es ms probable que se infecte con este virus si:  Vive o viaja a una zona donde hay un brote de COVID-19.  Carollee MassedEntra en contacto con una persona enferma que recientemente viaj a una zona con un brote de COVID-19.  Cuida o vive  con una persona infectada con COVID-19. Riesgo de enfermedad grave Es ms probable que se enferme gravemente por el virus si:  Tiene 67aos o ms.  Tiene una enfermedad crnica que disminuye la capacidad del cuerpo para combatir las infecciones (immunocomprometido).  Vive en un hogar de ancianos o centro de atencin a Air cabin crewlargo plazo.  Tiene una enfermedad prolongada (crnica), como las siguientes: ? Enfermedad pulmonar crnica, que incluye la enfermedad pulmonar obstructiva crnica o asma. ? Enfermedad cardaca. ? Diabetes. ? Enfermedad renal crnica. ? Enfermedad heptica.  Es obeso. Cules son los signos o sntomas? Los sntomas de esta afeccin pueden ser de leves a graves. Los sntomas pueden aparecer en el trmino de 2 a [address] das despus de haber estado expuesto al virus. Incluyen los siguientes:  Buffalo CenterFiebre.  Tos.  Dificultad para respirar.  Escalofros.  Dolores musculares.  Dolor de Advertising copywritergarganta.  Prdida del gusto o Cabin crewel olfato. Algunas personas tambin pueden Matteltener problemas estomacales, como nuseas, vmitos o diarrea. Es posible que otras personas no tengan sntomas de COVID-19. Cmo se diagnostica? Esta afeccin se puede diagnosticar en funcin de lo siguiente:  Sus signos y sntomas, especialmente si: ? Vive en una zona donde hay un brote de COVID-19. ? Viaj recientemente a una zona donde el virus es frecuente. ? Cuida o vive con Neomia Dearuna persona a quien se le diagnostic COVID-19.  Un examen fsico.  Anlisis de laboratorio que pueden incluir: ? Un hisopado nasal para tomar [place]una muestra de lquido de la nariz. ? Un hisopado de garganta para tomar Lauris Poaguna muestra de lquido de la garganta. ? Una muestra de mucosidad de los pulmones (esputo). ? Anlisis de Allentownsangre.  Los estudios de diagnstico por imgenes pueden incluir radiografas, exploracin por tomografa computarizada (TC) o ecografa. Cmo se trata? En este momento, no hay ningn medicamento para tratar la  COVID-19. Los medicamentos para tratar otras enfermedades se usan a modo de ensayo para comprobar si son eficaces contra la COVID-19. El mdico le informar sobre las maneras de tratar los sntomas. En la Franklin Resourcesmayora de las personas, la infeccin es leve y puede controlarse en el hogar con reposo, lquidos y medicamentos de [place]a libre. El tratamiento para una infeccin grave suele realizarse en la unidad de cuidados intensivos (UCI) de un hospital. Puede incluir uno o ms de los siguientes. Estos tratamientos se administran hasta que los sntomas mejoran.  Recibir lquidos y United Parcelmedicamentos a travs de una va intravenosa.  Oxgeno complementario. Para administrar oxgeno extra, se [place] ([place]) Islandsutiliza un tubo en la Darene Lamernariz, una mascarilla o una campana de oxgeno.  Colocarlo para que se recueste boca abajo (decbito prono). Esto facilita el ingreso de oxgeno a los pulmones.  Uso continuo de Comorosuna mquina de presin positiva de las vas areas (CPAP) o de presin positiva de las vas areas de  dos niveles (BPAP). Este tratamiento utiliza una presin de aire leve para Pharmacologist las vas respiratorias abiertas. Un tubo conectado a un motor administra oxgeno al cuerpo.  Respirador. Este tratamiento mueve el aire dentro y fuera de los pulmones mediante el uso de un tubo que se coloca en la trquea.  Traqueostoma. En este procedimiento se hace un orificio en el cuello para insertar un tubo de respiracin.  Oxigenacin por membrana extracorprea (OMEC). En este procedimiento, los pulmones tienen la posibilidad de recuperarse al asumir las funciones del corazn y los pulmones. Suministra oxgeno al cuerpo y elimina el dixido de carbono. Siga estas instrucciones en su casa: Estilo de vida  Si est enfermo, qudese en su casa, excepto para obtener atencin mdica. El mdico le indicar cunto tiempo debe quedarse en casa. Llame al mdico antes de buscar atencin mdica.  Haga reposo en su casa como se lo haya indicado el  mdico.  No consuma ningn producto que contenga nicotina o tabaco, como cigarrillos, cigarrillos electrnicos y tabaco de Theatre manager. Si necesita ayuda para dejar de fumar, consulte al mdico.  Retome sus actividades normales como se lo haya indicado el mdico. Pregntele al mdico qu actividades son seguras para usted. Instrucciones generales  Use los medicamentos de venta libre y los recetados solamente como se lo haya indicado el mdico.  Beba suficiente lquido como para Pharmacologist la orina de color amarillo plido.  Concurra a todas las visitas de [address] se lo haya indicado el mdico. Esto es importante. Cmo se evita?  No hay ninguna vacuna que ayude a prevenir la infeccin por la COVID-19. Sin embargo, hay medidas que puede tomar para protegerse y Conservator, museum/gallery a Economist de este virus. Para protegerse:   No viaje a zonas donde la COVID-19 sea un riesgo. Las zonas donde se informa la presencia de la COVID-19 [place] con frecuencia. Para identificar las zonas de alto riesgo y las restricciones de viaje, consulte el sitio web de viajes de Building control surveyor for [employer] and Prevention Insurance claims handler) (Centros para el Control y la Prevencin de Event organiser): StageSync.si  Si vive o debe viajar a una zona donde COVID-19 es un riesgo, tome precauciones para evitar infecciones. ? Aljese de [employer]. ? Lvese las manos frecuentemente con agua y [place]. Use desinfectante para manos con alcohol si no dispone de [place] y [place]. ? Evite tocarse la boca, la cara, los ojos o la [place]. ? Evite salir de su casa, siga las indicaciones de su estado y de las autoridades sanitarias locales. ? Si debe salir de su casa, use un barbijo de tela o una mascarilla facial. ? Desinfecte los objetos y las superficies que se tocan con frecuencia todos [place]. Pueden incluir:  Encimeras y [place].  Picaportes e interruptores de luz.  Lavabos, fregaderos y  grifos.  Aparatos electrnicos tales como telfonos, controles remotos, teclados, computadoras y tabletas. Cmo proteger a los dems: Si tiene sntomas de la COVID-19, tome medidas para evitar que el virus se propague a Economist.  Si cree que tiene una infeccin por la COVID-19, comunquese de inmediato con su mdico. Informe al equipo de atencin mdica que cree que puede tener una infeccin por la COVID-19.  Qudese en su casa. Salga de su casa solo para buscar atencin mdica. No utilice el transporte pblico.  No viaje mientras est enfermo.  Lvese las manos frecuentemente con agua y [place]. Usar desinfectante para manos con alcohol si no dispone de [place] y  jabn.  Mantngase alejado de quienes vivan con usted. Permita que los miembros de la familia sanos cuiden a los nios y las [place], si es posible. Si tiene que cuidar a los nios o las mascotas, lvese las manos con frecuencia y use un barbijo. Si es posible, permanezca en su habitacin, separado de los dems. Utilice un bao diferente.  Asegrese de que todas las personas que viven en su casa se laven bien las manos y con frecuencia.  Tosa o estornude en un pauelo de papel o sobre su manga o codo. No tosa o estornude al aire ni se cubra la boca o la nariz con la [place].  Use un barbijo de tela o una mascarilla facial. Dnde buscar ms informacin  Centers for Disease Control and Prevention (Centros para el Control y la Prevencin de Event organiser): StickerEmporium.tn  World Health Organization (Organizacin Mundial de la Salud): https://thompson-craig.com/ Comunquese con un mdico si:  Vive o ha viajado a una zona donde la COVID-19 es un riesgo y tiene sntomas de infeccin.  Ha tenido contacto con alguien que tiene COVID-19 y usted tiene sntomas de infeccin. Solicite ayuda de inmediato si:  Tiene dificultad para respirar.  Siente dolor u opresin en el  pecho.  Experimenta confusin.  Tiene las uas de los dedos y los labios de color [place].  Tiene dificultad para despertarse.  Los sntomas empeoran. Estos sntomas pueden representar un problema grave que constituye Radio broadcast assistant. No espere a ver si los sntomas desaparecen. Solicite atencin mdica de inmediato. Comunquese con el servicio de emergencias de su localidad (911 en los [place]). No conduzca por sus propios medios [employer] hospital. Informe al personal mdico de emergencias si cree que tiene COVID-19. Resumen  La COVID-19 es una infeccin respiratoria causada por un virus. Tambin se conoce como enfermedad por coronavirus o nuevo coronavirus. Puede causar infecciones graves, como neumona, sndrome de dificultad respiratoria aguda, insuficiencia respiratoria aguda o sepsis.  El virus que causa la COVID-19 es contagioso. Esto significa que puede transmitirse de [place] persona a otra a travs de las gotitas de saliva de la tos y de los estornudos.  Es ms probable que desarrolle una enfermedad grave si tiene 65 aos o ms, tiene un sistema inmunitario dbil, vive en un hogar de ancianos o tiene enfermedad crnica.  No hay ningn medicamento para tratar la COVID-19. El mdico le informar sobre las maneras de tratar los sntomas.  Tome medidas para protegerse y Conservator, museum/gallery a los Merchandiser, retail las infecciones. Lvese las manos con frecuencia y desinfecte los objetos y las superficies que se tocan con frecuencia todos [place]. Mantngase alejado de las personas que estn enfermas y use un barbijo si est enfermo. Esta informacin no tiene Theme park manager el consejo del mdico. Asegrese de hacerle al mdico cualquier pregunta que tenga. Document Released: 01/07/2019 Document Revised: 04/11/2019 Document Reviewed: 01/07/2019 Elsevier Patient Education  2020 [employer].

## 2019-09-04 NOTE — Progress Notes (Signed)
Declines flu shot.  Needs refills on her Imdur.  Checks FSBS occasionally(a few days a week). Readings have been between 113-223. She doesn't check at the same time every day.  BP readings have been between 120-130/70-80.

## 2019-09-04 NOTE — Progress Notes (Signed)
Established Patient Office Visit  Subjective:  Patient ID: Erin Estes, female    DOB: 1946/09/12  Age: 73 y.o. MRN: [id number]  CC:  Chief Complaint  Patient presents with  . Diabetes  . Hypertension  . Hyperlipidemia   Stratus video interpretation system used at today's visit secondary to language barrier  HPI Erin Estes presents for ongoing medical management of chronic issues but was seen in the office on 07/04/2019 for these issues as well as low back pain with radiation and was told to follow-up in 2 months per her last note. Patient has had Hgb A1c done on 06/01/2019 which was good at 6.9.        At today's visit, patient reports that her blood sugar continues to remain well controlled with blood sugars generally running between 90-110.  She denies any issues with increased urinary frequency, increased thirst or blurred vision.  She denies any numbness or tingling in her feet.  She continues to take her blood pressure medicines but has been out of Imdur and has had some headaches.  She also feels as if she may have a cold or influenza at today's visit as she has had some recent chills, mild sore throat and muscle aches.  She denies any loss of sense of taste or smell.  She does not believe that she has had any exposure to anyone with COVID-19 and she is not interested in COVID-19 testing. (She was made aware of drive up testing site should she have worsening of symptoms or any concerns).        She denies any chest pain or palpitations related to her heart disease. She also reports that her she continues to take her plavix and aspirin but has noticed some bruising on her arms and legs without any injury or trauma to these areas. She has had no unusual bleeding. She does not think that she is having any increase in muscle or joint pain related to her use of cholesterol medication. She does report that her back pain with radiation is better at this time but tends to flare-up if she does  anything strenuous and she would like a refill of pain medication to take if needed for future flare-ups.   Past Medical History:  Diagnosis Date  . CAD (coronary artery disease)   . Diabetes mellitus without complication ([place])   . Hyperlipidemia   . Hypertension     Past Surgical History:  Procedure Laterality Date  . CORONARY ANGIOPLASTY N/A   . HEMORRHOID SURGERY    . KNEE SURGERY Left     Family History  Problem Relation Age of Onset  . Hypertension Sister   . Hypertension Sister   . Stroke Neg Hx     Social History   Socioeconomic History  . Marital status: Significant Other    Spouse name: Not on file  . Number of children: Not on file  . Years of education: Not on file  . Highest education level: Not on file  Occupational History  . Not on file  Social Needs  . Financial resource strain: Not on file  . Food insecurity    Worry: Not on file    Inability: Not on file  . Transportation needs    Medical: Not on file    Non-medical: Not on file  Tobacco Use  . Smoking status: Never Smoker  . Smokeless tobacco: Never Used  Substance and Sexual Activity  . Alcohol use: Not Currently  Frequency: Never  . Drug use: Never  . Sexual activity: Not on file  Lifestyle  . Physical activity    Days per week: Not on file    Minutes per session: Not on file  . Stress: Not on file  Relationships  . Social Herbalist on phone: Not on file    Gets together: Not on file    Attends religious service: Not on file    Active member of club or organization: Not on file    Attends meetings of clubs or organizations: Not on file    Relationship status: Not on file  . Intimate partner violence    Fear of current or ex partner: Not on file    Emotionally abused: Not on file    Physically abused: Not on file    Forced sexual activity: Not on file  Other Topics Concern  . Not on file  Social History Narrative   ** Merged History Encounter **         Outpatient Medications Prior to Visit  Medication Sig Dispense Refill  . amLODipine (NORVASC) 5 MG tablet Take 1 tablet (5 mg total) by mouth daily. 30 tablet 6  . aspirin 81 MG chewable tablet Chew 81 mg by mouth daily.    Marland Kitchen atorvastatin (LIPITOR) 80 MG tablet Take 1 tablet (80 mg total) by mouth daily. 90 tablet 1  . Blood Glucose Monitoring Suppl (ONETOUCH VERIO) w/Device KIT Use to check FSBS BID. Dx: E11.59 1 kit 0  . clopidogrel (PLAVIX) 75 MG tablet Take 1 tablet (75 mg total) by mouth daily. 90 tablet 1  . glucose blood (ONETOUCH VERIO) test strip Use to check FSBS BID. Dx: E11.59. 100 each 1  . isosorbide mononitrate (IMDUR) 60 MG 24 hr tablet Take 1 tablet (60 mg total) by mouth daily. 90 tablet 1  . Lancets (ONETOUCH ULTRASOFT) lancets Use to check FSBS BID. Dx: E11.59 100 each 1  . lisinopril (PRINIVIL,ZESTRIL) 40 MG tablet Take 1 tablet (40 mg total) by mouth daily. 90 tablet 1  . metFORMIN (GLUCOPHAGE) 500 MG tablet Take 2 tablets by mouth in the with breakfast and take 1 tablet by mouth with dinner. 270 tablet 3  . metoprolol succinate (TOPROL-XL) 25 MG 24 hr tablet Take 1 tablet (25 mg total) by mouth daily. 90 tablet 3  . traMADol (ULTRAM) 50 MG tablet Take 1 tablet (50 mg total) by mouth every 8 (eight) hours as needed. 20 tablet 0   No facility-administered medications prior to visit.     No Known Allergies  ROS Review of Systems  Constitutional: Positive for chills and fatigue. Negative for fever.  HENT: Positive for congestion and sore throat. Negative for trouble swallowing.   Eyes: Negative for photophobia and visual disturbance.  Respiratory: Negative for cough and shortness of breath.   Cardiovascular: Negative for chest pain, palpitations and leg swelling.  Gastrointestinal: Negative for abdominal pain, blood in stool, constipation, diarrhea and nausea.  Endocrine: Negative for cold intolerance, heat intolerance, polydipsia, polyphagia and polyuria.   Genitourinary: Negative for dysuria and frequency.  Musculoskeletal: Positive for back pain. Negative for arthralgias and gait problem.  Neurological: Positive for headaches. Negative for dizziness.  Hematological: Negative for adenopathy. Bruises/bleeds easily.      Objective:    Physical Exam  Constitutional: She is oriented to person, place, and time and well-developed, well-nourished, and in no distress.  WNWD older female in NAD who appears slightly fatigued; appears younger than  stated age; patient wearing face mask as per office COVID-19 protocol  Neck: Normal range of motion. Neck supple. No JVD present. Thyromegaly present.  Left carotid bruit  Cardiovascular: Normal rate, regular rhythm and intact distal pulses.  Left carotid bruit.  2+ dorsalis pedis and posterior tibial pulses bilaterally  Pulmonary/Chest: Effort normal and breath sounds normal.  Abdominal: Soft. There is no abdominal tenderness. There is no rebound and no guarding.  Musculoskeletal:        General: Tenderness present. No edema.     Comments: No CVA tenderness, patient with a left SI joint tenderness and mild lumbosacral discomfort to palpation  Lymphadenopathy:    She has no cervical adenopathy.  Neurological: She is alert and oriented to person, place, and time.  Skin: Skin is warm and dry.  Psychiatric: Affect normal.  Nursing note and vitals reviewed. Diabetic foot exam- patient with some mild thickening and mild discoloration of the great toenails bilaterally left greater than right, 2+ dorsalis pedis and posterior tibial pulses, no active skin breakdown on the feet.  Mild bilateral hammertoe deformities, normal monofilament exam bilaterally  BP (!) 149/81   Pulse 72   Temp (!) 97 F (36.1 C) (Temporal)   Resp 17   Wt 174 lb (78.9 kg)   SpO2 97%   BMI 30.34 kg/m  Blood pressure is slightly above goal but patient is out of Imdur Wt Readings from Last 3 Encounters:  09/04/19 174 lb (78.9 kg)   07/04/19 175 lb 9.6 oz (79.7 kg)  06/02/19 175 lb 9.6 oz (79.7 kg)     Health Maintenance Due  Topic Date Due  . Hepatitis C Screening  1946/09/11  . FOOT EXAM  06/23/1958  . OPHTHALMOLOGY EXAM  06/23/1958  . COLONOSCOPY  06/23/1998  . DEXA SCAN  06/23/2013  . MAMMOGRAM  09/18/2016   Diabetic foot exam done at today's visit  Lab Results  Component Value Date   TSH 3.540 06/01/2019   Lab Results  Component Value Date   WBC 8.9 12/09/2018   HGB 11.1 12/09/2018   HCT 35.4 12/09/2018   MCV 69 (L) 12/09/2018   PLT 257 12/09/2018   Lab Results  Component Value Date   NA 140 06/01/2019   K 5.2 06/01/2019   CO2 23 06/01/2019   GLUCOSE 108 (H) 06/01/2019   BUN 15 06/01/2019   CREATININE 0.97 06/01/2019   BILITOT <0.2 06/01/2019   ALKPHOS 80 06/01/2019   AST 29 06/01/2019   ALT 36 (H) 06/01/2019   PROT 7.2 06/01/2019   ALBUMIN 4.5 06/01/2019   CALCIUM 9.8 06/01/2019   Lab Results  Component Value Date   CHOL 145 12/09/2018   Lab Results  Component Value Date   HDL 40 12/09/2018   Lab Results  Component Value Date   LDLCALC 85 12/09/2018   Lab Results  Component Value Date   TRIG 99 12/09/2018   Lab Results  Component Value Date   CHOLHDL 3.6 12/09/2018   Lab Results  Component Value Date   HGBA1C 6.9 (H) 06/01/2019      Assessment & Plan:   1. Controlled type 2 diabetes mellitus without complication, without long-term current use of insulin ([place]) Patient's most recent hemoglobin A1c on 06/01/2019 showed good control of diabetes with A1c of 6.9.  She is encouraged to continue to keep hemoglobin A1c at 7 or less to help reduce long-term complications from diabetes and because of her history of CAD.  She will have hemoglobin A1c  and comprehensive metabolic panel done at today's visit.  She is encouraged to continue her current medication, metformin and a low carbohydrate diet along with regular exercise.  She is also encouraged to schedule yearly diabetic  eye exam and diabetic foot care was discussed at today's visit. Patient's recent chart notes, labs and imaging reviewed before, during and after patient's visit. Some information especially recent labs were discussed with patient and she was given the opportunity to ask questions and these were answered to her satisfaction.  - Hemoglobin A1c - Comprehensive metabolic panel - VAS US CAROTID; Future  2. Lumbar radiculopathy Patient reports no current lumbar radiculopathy at today's visit but she does have onset of back pain with radiation with certain activity or if she is more active.  She is provided with refill of tramadol to take if needed for exacerbation of back pain with radiation. - traMADol (ULTRAM) 50 MG tablet; Take 1 tablet (50 mg total) by mouth every 8 (eight) hours as needed.  Dispense: 20 tablet; Refill: 1  3. CAD, multiple vessel, 2 stent  Patient with CAD status post stenting of 2 vessels.  Patient denies any chest pain at this time.  She was currently out of her Imdur which was refilled at today's visit.  Discussed secondary prevention including continuing use of aspirin and Plavix, making sure that her blood sugars are well controlled with hemoglobin A1c of 7 or less, controlling blood pressure with goal of 130/80 or less, regular exercise, as well as tobacco cessation/avoidance that she states that she did smoke remotely in the past for about a year but has not smoked recently.  Patient will have CMP in follow-up of med use as well as CBC in follow-up of use of antiplatelet therapy.  Patient also will have carotid ultrasound secondary to left carotid bruit as patient with known atherosclerotic disease. - Comprehensive metabolic panel - isosorbide mononitrate (IMDUR) 60 MG 24 hr tablet; Take 1 tablet (60 mg total) by mouth daily.  Dispense: 90 tablet; Refill: 3 - CBC with Differential - VAS US CAROTID; Future  4. Essential hypertension Patient is currently on amlodipine 5 mg,  lisinopril 40 mg and metoprolol 25 mg.  Patient's blood pressure was slightly above normal at today's visit but she was out of her Imdur which was refilled at today's visit and will likely further lower patient's blood pressure into expected range of 130/80 or less. - isosorbide mononitrate (IMDUR) 60 MG 24 hr tablet; Take 1 tablet (60 mg total) by mouth daily.  Dispense: 90 tablet; Refill: 3  5. Mixed hyperlipidemia Patient with mixed hyperlipidemia as well as known coronary artery disease status post stenting.  Patient will have comprehensive metabolic panel in follow-up of statin use.  She is encouraged to continue a low-fat diet, continue secondary prevention for CAD including controlling lipids/continuing atorvastatin 80 mg daily. - Comprehensive metabolic panel - VAS US CAROTID; Future  6. Bruising; 7. Encounter for current long term use of antiplatelet drug Patient with complaint of bruising and patient currently on Plavix as well as aspirin.  Patient will have CBC to look for anemia or thrombocytopenia as the cause of her bruising.  She denies any unusual bleeding such as nosebleeds, gum bleeding, blood in the stool or blood in the urine - CBC with Differential  8. Encounter for long-term current use of medication Patient will have comprehensive metabolic panel in follow-up of long-term use of medications for treatment of diabetes, hyperlipidemia and coronary artery disease - Comprehensive metabolic  panel  9. Left carotid bruit Patient with left carotid bruit on exam and patient with known peripheral vascular disease/atherosclerosis.  Patient will be scheduled for vascular ultrasound of the carotids and will be notified if further evaluations/referrals are needed based on these results. - VAS US CAROTID; Future   An After Visit Summary was printed and given to the patient.  Follow-up: Return in about 4 months (around 01/05/2020) for chronic issues. and as needed   Antony Blackbird, MD

## 2019-09-05 ENCOUNTER — Other Ambulatory Visit: Payer: Self-pay | Admitting: Family Medicine

## 2019-09-05 DIAGNOSIS — D509 Iron deficiency anemia, unspecified: Secondary | ICD-10-CM

## 2019-09-05 DIAGNOSIS — Z7982 Long term (current) use of aspirin: Secondary | ICD-10-CM

## 2019-09-05 LAB — COMPREHENSIVE METABOLIC PANEL WITH GFR
ALT: 31 IU/L (ref 0–32)
AST: 20 IU/L (ref 0–40)
Albumin/Globulin Ratio: 1.6 (ref 1.2–2.2)
Albumin: 4.2 g/dL (ref 3.7–4.7)
Alkaline Phosphatase: 99 IU/L (ref 39–117)
BUN/Creatinine Ratio: 15 (ref 12–28)
BUN: 11 mg/dL (ref 8–27)
Bilirubin Total: <0.2 mg/dL (ref 0.0–1.2)
CO2: 24 mmol/L (ref 20–29)
Calcium: 9.5 mg/dL (ref 8.7–10.3)
Chloride: 105 mmol/L (ref 96–106)
Creatinine, Ser: 0.75 mg/dL (ref 0.57–1.00)
GFR calc Af Amer: 93 mL/min/1.73
GFR calc non Af Amer: 80 mL/min/1.73
Globulin, Total: 2.7 g/dL (ref 1.5–4.5)
Glucose: 108 mg/dL — ABNORMAL HIGH (ref 65–99)
Potassium: 4.2 mmol/L (ref 3.5–5.2)
Sodium: 143 mmol/L (ref 134–144)
Total Protein: 6.9 g/dL (ref 6.0–8.5)

## 2019-09-05 LAB — CBC WITH DIFFERENTIAL/PLATELET
Basophils Absolute: 0 x10E3/uL (ref 0.0–0.2)
Basos: 0 %
EOS (ABSOLUTE): 0.1 x10E3/uL (ref 0.0–0.4)
Eos: 1 %
Hematocrit: 34.9 % (ref 34.0–46.6)
Hemoglobin: 10.6 g/dL — ABNORMAL LOW (ref 11.1–15.9)
Immature Grans (Abs): 0 x10E3/uL (ref 0.0–0.1)
Immature Granulocytes: 0 %
Lymphocytes Absolute: 2.8 x10E3/uL (ref 0.7–3.1)
Lymphs: 31 %
MCH: 21.5 pg — ABNORMAL LOW (ref 26.6–33.0)
MCHC: 30.4 g/dL — ABNORMAL LOW (ref 31.5–35.7)
MCV: 71 fL — ABNORMAL LOW (ref 79–97)
Monocytes Absolute: 0.7 x10E3/uL (ref 0.1–0.9)
Monocytes: 7 %
Neutrophils Absolute: 5.5 x10E3/uL (ref 1.4–7.0)
Neutrophils: 61 %
Platelets: 271 x10E3/uL (ref 150–450)
RBC: 4.93 x10E6/uL (ref 3.77–5.28)
RDW: 15.8 % — ABNORMAL HIGH (ref 11.7–15.4)
WBC: 9 x10E3/uL (ref 3.4–10.8)

## 2019-09-05 LAB — HEMOGLOBIN A1C
Est. average glucose Bld gHb Est-mCnc: 157 mg/dL
Hgb A1c MFr Bld: 7.1 % — ABNORMAL HIGH (ref 4.8–5.6)

## 2019-09-05 MED ORDER — PANTOPRAZOLE SODIUM 40 MG PO TBEC: 40.0000 mg | DELAYED_RELEASE_TABLET | Freq: Every day | ORAL | 11 refills | Status: DC

## 2019-09-05 MED ORDER — FERROUS SULFATE 325 (65 FE) MG PO TBEC: DELAYED_RELEASE_TABLET | ORAL | 3 refills | Status: DC

## 2019-09-05 NOTE — Progress Notes (Signed)
Patient ID: Erin Estes, female   DOB: 08/17/1946, 73 y.o.   MRN: [id number]   Patient is currently on Plavix and aspirin due to CAD history. At her recent visit, patient had complaint of bruising and CBC was done which shows anemia with hemoglobin of 10.6 and low MCV.  Patient will be contacted and asked to start ferrous sulfate once daily and will also be placed on pantoprazole for stomach protection.  Patient will have repeat CBC in the next 4 to 5 weeks.

## 2019-09-05 NOTE — Telephone Encounter (Signed)
Requested medication (s) are due for refill today: yes  Requested medication (s) are on the active medication list: yes  Last refill:  05/15/2019  Future visit scheduled: no  Notes to clinic:  Review for refill Last prescribed by different provider    Requested Prescriptions  Pending Prescriptions Disp Refills   atorvastatin (LIPITOR) 80 MG tablet [Pharmacy Med Name: ATORVASTATIN 80 MG TABLET] 90 tablet 1    Sig: TOME UNA TABLETA TODOS LOS DIAS     There is no refill protocol information for this order     lisinopril (ZESTRIL) 40 MG tablet [Pharmacy Med Name: LISINOPRIL 40 MG TABLET] 90 tablet 1    Sig: TOME UNA TABLETA TODOS LOS DIAS     There is no refill protocol information for this order

## 2019-09-12 ENCOUNTER — Ambulatory Visit (HOSPITAL_COMMUNITY): Admission: RE | Admit: 2019-09-12 | Payer: Medicare Other | Source: Ambulatory Visit

## 2019-10-02 ENCOUNTER — Telehealth: Payer: Self-pay | Admitting: *Deleted

## 2019-10-02 NOTE — Telephone Encounter (Signed)
Patient ask if we will call back,she can't speak english.

## 2019-10-16 NOTE — Telephone Encounter (Signed)
Unable to reach, no voicemail.

## 2019-11-28 ENCOUNTER — Other Ambulatory Visit: Payer: Self-pay

## 2019-11-28 ENCOUNTER — Telehealth: Payer: Self-pay | Admitting: Family Medicine

## 2019-11-28 DIAGNOSIS — M5416 Radiculopathy, lumbar region: Secondary | ICD-10-CM

## 2019-11-28 MED ORDER — TRAMADOL HCL 50 MG PO TABS: 50.0000 mg | ORAL_TABLET | Freq: Three times a day (TID) | ORAL | 1 refills | Status: DC | PRN

## 2019-11-28 NOTE — Telephone Encounter (Signed)
1) Medication(s) Requested (by name): amLODipine (NORVASC) 5 MG tablet [[id number]] isosorbide mononitrate (IMDUR) 60 MG 24 hr tablet [[id number]]  clopidogrel (PLAVIX) 75 MG tablet [[id number]]  traMADol (ULTRAM) 50 MG tablet [[id number]]  2) Pharmacy of Choice: [id number] - [place], [place] - [address]  [address] [place] [address]     Approved medications will be sent to pharmacy, we will reach out to you if there is an issue.  Requests made after 3pm may not be addressed until following business day!

## 2019-12-06 ENCOUNTER — Telehealth: Payer: Self-pay

## 2019-12-06 NOTE — Telephone Encounter (Signed)
Called patient to do their pre-visit COVID screening(Erin Estes [phone].  Have you tested positive for COVID or are you currently waiting for COVID test results? no  Have you recently traveled internationally([place], [place], [place], [place], [place]) or within the [place] to a hotspot area([place], [place], [place], [place], [place])? no  Are you currently experiencing any of the following symptoms: fever, cough, SHOB, fatigue, body aches, loss of smell/taste, rash, diarrhea, vomiting, severe headaches, weakness, sore throat? no  Have you been in contact with anyone who has recently travelled? no  Have you been in contact with anyone who is experiencing any of the above symptoms or been diagnosed with COVID  or works in or has recently visited a SNF? no

## 2019-12-07 ENCOUNTER — Ambulatory Visit (INDEPENDENT_AMBULATORY_CARE_PROVIDER_SITE_OTHER): Payer: Medicare Other | Admitting: Internal Medicine

## 2019-12-07 ENCOUNTER — Other Ambulatory Visit: Payer: Self-pay

## 2019-12-07 ENCOUNTER — Encounter: Payer: Self-pay | Admitting: Internal Medicine

## 2019-12-07 VITALS — BP 96/59 | HR 81 | Temp 97.5°F | Resp 17 | Ht 63.0 in | Wt 175.0 lb

## 2019-12-07 DIAGNOSIS — D509 Iron deficiency anemia, unspecified: Secondary | ICD-10-CM | POA: Diagnosis not present

## 2019-12-07 DIAGNOSIS — E782 Mixed hyperlipidemia: Secondary | ICD-10-CM

## 2019-12-07 DIAGNOSIS — E119 Type 2 diabetes mellitus without complications: Secondary | ICD-10-CM

## 2019-12-07 DIAGNOSIS — I2510 Atherosclerotic heart disease of native coronary artery without angina pectoris: Secondary | ICD-10-CM

## 2019-12-07 DIAGNOSIS — Z1159 Encounter for screening for other viral diseases: Secondary | ICD-10-CM

## 2019-12-07 DIAGNOSIS — I10 Essential (primary) hypertension: Secondary | ICD-10-CM

## 2019-12-07 DIAGNOSIS — Z7982 Long term (current) use of aspirin: Secondary | ICD-10-CM

## 2019-12-07 MED ORDER — LISINOPRIL 40 MG PO TABS: ORAL_TABLET | ORAL | 1 refills | Status: DC

## 2019-12-07 MED ORDER — ISOSORBIDE MONONITRATE ER 120 MG PO TB24: 120.0000 mg | ORAL_TABLET | Freq: Every day | ORAL | 0 refills | Status: DC

## 2019-12-07 MED ORDER — ATORVASTATIN CALCIUM 80 MG PO TABS: ORAL_TABLET | ORAL | 1 refills | Status: DC

## 2019-12-07 MED ORDER — METFORMIN HCL 500 MG PO TABS: ORAL_TABLET | ORAL | 3 refills | Status: DC

## 2019-12-07 MED ORDER — METOPROLOL SUCCINATE ER 25 MG PO TB24: 25.0000 mg | ORAL_TABLET | Freq: Every day | ORAL | 3 refills | Status: DC

## 2019-12-07 MED ORDER — PANTOPRAZOLE SODIUM 40 MG PO TBEC: 40.0000 mg | DELAYED_RELEASE_TABLET | Freq: Every day | ORAL | 11 refills | Status: DC

## 2019-12-07 NOTE — Progress Notes (Signed)
Subjective:    Erin Estes - 74 y.o. female MRN [id number]  Date of birth: February 29, 1946  HPI  Erin Estes is here for follow up of chronic medical problems.   Chronic HTN Disease Monitoring:  Home BP Monitoring - Yes. Monitors at home. Reports checks every 5 days on average, checking at different times of the day. Reports that numbers range from 90-200s systolic. Chest pain- yes, chronic angina; reports that was previously higher dose of a medication (doesn't recall which one) that made her chest pain better   Dyspnea- no Headache - no  Medications: Amlodipine 5 mg, Imdur 60 mg, Lisinopril 40 mg, Metoprolol XL 25 mg  Compliance- yes Lightheadedness- no  Edema- no   Diabetes mellitus, Type 2 Disease Monitoring Blood Sugar Ranges: Fasting - 110-160  Polyuria: no  Visual problems: no   Urine Microalbumin On Ace Inhibitor therapy   Last A1C: 7.1 (Oct 2020)   Medication Compliance: yes  Medication Side Effects Hypoglycemia: no   Preventitive Health Care Eye Exam: Needs  Foot Exam: Performed      Health Maintenance Due  Topic Date Due  . Hepatitis C Screening  [birth year]  . OPHTHALMOLOGY EXAM  06/23/1958  . COLONOSCOPY  06/23/1998  . DEXA SCAN  06/23/2013  . MAMMOGRAM  09/18/2016    -  reports that she has never smoked. She has never used smokeless tobacco. - Review of Systems: Per HPI. - Past Medical History: Patient Active Problem List   Diagnosis Date Noted  . Microcytic anemia 12/07/2019  . Diabetes mellitus ([facility]) 12/13/2018  . Essential hypertension 12/13/2018  . Mixed hyperlipidemia 12/13/2018  . CAD, multiple vessel, 2 stent  12/13/2018  . HYPERTENSION 03/14/2009  . SCIATICA 03/14/2009   - Medications: reviewed and updated   Objective:   Physical Exam BP (!) 96/59   Pulse 81   Temp (!) 97.5 F (36.4 C) (Temporal)   Resp 17   Ht 5\' 3"  (1.6 m)   Wt 175 lb  (79.4 kg)   SpO2 95%   BMI 31.00 kg/m  Physical Exam  Constitutional: She is oriented to person, place, and time and well-developed, well-nourished, and in no distress. No distress.  Eyes: Conjunctivae and EOM are normal.  Cardiovascular: Normal rate, regular rhythm and normal heart sounds.  No murmur heard. Pulmonary/Chest: Effort normal and breath sounds normal. No respiratory distress. She has no wheezes.  Musculoskeletal:     Cervical back: Normal range of motion and neck supple.  Neurological: She is alert and oriented to person, place, and time.  Skin: Skin is warm and dry. She is not diaphoretic.  Diabetic Foot Check -  Appearance - no lesions, ulcers or calluses Skin - no unusual pallor or redness Monofilament testing - normal bilaterally  Right - Great toe, medial, central, lateral ball and posterior foot intact Left - Great toe, medial, central, lateral ball and posterior foot intact  Psychiatric: Affect and judgment normal.           Assessment & Plan:     1. Controlled type 2 diabetes mellitus without complication, without long-term current use of insulin ([facility]) -encouraged to reschedule eye exam  -diabetic foot exam completed  -continue metformin at current dose  - A1c - lisinopril (ZESTRIL) 40 MG tablet; TOME UNA TABLETA TODOS LOS DIAS  Dispense: 90 tablet; Refill: 1 - metFORMIN (GLUCOPHAGE) 500 MG tablet; Take 2 tablets by mouth in the with breakfast and take 1 tablet by mouth with dinner.  Dispense: 270 tablet;  Refill: 3  2. Essential hypertension BP soft today. Patient asymptomatic. Reports wide range of BPs at home. BP elevated in Oct and soft in August of past year at office visits. Will increase Imdur to help with chronic angina. Discontinue Norvasc. Do not want to discontinue Metoprolol due to CAD or Lisinopril due to DM. If angina persists, consider increase in Metoprolol. Have room to decrease dose of Lisinopril if needed. Would like patient to return for  BP check up with her home cuff for comparison to office numbers. If continues to have wide range of BP, would like benefit from 24h ambulatory BP monitoring to get a better sense of what her BP typically is.  - isosorbide mononitrate (IMDUR) 120 MG 24 hr tablet; Take 1 tablet (120 mg total) by mouth daily.  Dispense: 90 tablet; Refill: 0 - lisinopril (ZESTRIL) 40 MG tablet; TOME UNA TABLETA TODOS LOS DIAS  Dispense: 90 tablet; Refill: 1 - metoprolol succinate (TOPROL-XL) 25 MG 24 hr tablet; Take 1 tablet (25 mg total) by mouth daily.  Dispense: 90 tablet; Refill: 3  3. Mixed hyperlipidemia - Lipid Panel - atorvastatin (LIPITOR) 80 MG tablet; TOME UNA TABLETA TODOS LOS DIAS  Dispense: 90 tablet; Refill: 1  4. Need for hepatitis C screening test - Hep C  5. Microcytic anemia Patient does not have Fe supplements here. She is unsure if she has been taking it. Will repeat CBC today and see what HgB is today. May need to re-prescribe Fe.  - CBC - pantoprazole (PROTONIX) 40 MG tablet; Take 1 tablet (40 mg total) by mouth daily. For stomach protection  Dispense: 30 tablet; Refill: 11  6. CAD, multiple vessel, 2 stent  Patient complaining of chronic stable angina. Will increase Imdur dose to see if this helps. May need follow up with cardiology.  - isosorbide mononitrate (IMDUR) 120 MG 24 hr tablet; Take 1 tablet (120 mg total) by mouth daily.  Dispense: 90 tablet; Refill: 0 - metoprolol succinate (TOPROL-XL) 25 MG 24 hr tablet; Take 1 tablet (25 mg total) by mouth daily.  Dispense: 90 tablet; Refill: 3  7. Long term (current) use of aspirin - pantoprazole (PROTONIX) 40 MG tablet; Take 1 tablet (40 mg total) by mouth daily. For stomach protection  Dispense: 30 tablet; Refill: [place], D.O. 12/07/2019, 3:40 PM Primary Care at [facility]

## 2019-12-07 NOTE — Patient Instructions (Signed)
Bring your blood pressure cuff with you at your next visit.

## 2019-12-08 ENCOUNTER — Other Ambulatory Visit: Payer: Self-pay | Admitting: Internal Medicine

## 2019-12-08 LAB — LIPID PANEL
Chol/HDL Ratio: 4.3 ratio (ref 0.0–4.4)
Cholesterol, Total: 188 mg/dL (ref 100–199)
HDL: 44 mg/dL (ref >39)
LDL Chol Calc (NIH): 107 mg/dL — ABNORMAL HIGH (ref 0–99)
Triglycerides: 212 mg/dL — ABNORMAL HIGH (ref 0–149)
VLDL Cholesterol Cal: 37 mg/dL (ref 5–40)

## 2019-12-08 LAB — CBC
Hematocrit: 37.1 % (ref 34.0–46.6)
Hemoglobin: 10.9 g/dL — ABNORMAL LOW (ref 11.1–15.9)
MCH: 21 pg — ABNORMAL LOW (ref 26.6–33.0)
MCHC: 29.4 g/dL — ABNORMAL LOW (ref 31.5–35.7)
MCV: 72 fL — ABNORMAL LOW (ref 79–97)
Platelets: 321 10*3/uL (ref 150–450)
RBC: 5.19 x10E6/uL (ref 3.77–5.28)
RDW: 15.1 % (ref 11.7–15.4)
WBC: 11.1 10*3/uL — ABNORMAL HIGH (ref 3.4–10.8)

## 2019-12-08 LAB — HEPATITIS C ANTIBODY: Hep C Virus Ab: <0.1 s/co ratio (ref 0.0–0.9)

## 2019-12-08 LAB — HEMOGLOBIN A1C
Est. average glucose Bld gHb Est-mCnc: 157 mg/dL
Hgb A1c MFr Bld: 7.1 % — ABNORMAL HIGH (ref 4.8–5.6)

## 2019-12-08 MED ORDER — FERROUS SULFATE 324 (65 FE) MG PO TBEC: 1.0000 | DELAYED_RELEASE_TABLET | Freq: Every day | ORAL | 5 refills | Status: DC

## 2019-12-16 ENCOUNTER — Ambulatory Visit
Admission: EM | Admit: 2019-12-16 | Discharge: 2019-12-16 | Disposition: A | Discharge status: Home / Self Care | Payer: Medicare Other

## 2019-12-16 ENCOUNTER — Other Ambulatory Visit: Payer: Self-pay

## 2019-12-26 NOTE — Progress Notes (Signed)
Patient notified of results & recommendations. Expressed understanding. Per request, lab letter mailed also.

## 2020-01-05 ENCOUNTER — Telehealth: Payer: Self-pay

## 2020-01-05 NOTE — Telephone Encounter (Signed)
Called patient to do their pre-visit COVID screening.  Call went to voicemail. Unable to do prescreening.  

## 2020-01-08 ENCOUNTER — Ambulatory Visit: Payer: Medicare Other | Admitting: Internal Medicine

## 2020-01-15 ENCOUNTER — Ambulatory Visit: Payer: Medicare Other

## 2020-02-24 ENCOUNTER — Other Ambulatory Visit: Payer: Self-pay | Admitting: Internal Medicine

## 2020-10-11 ENCOUNTER — Other Ambulatory Visit: Payer: Self-pay | Admitting: Internal Medicine

## 2020-10-11 ENCOUNTER — Telehealth: Payer: Self-pay

## 2020-10-11 DIAGNOSIS — I2510 Atherosclerotic heart disease of native coronary artery without angina pectoris: Secondary | ICD-10-CM

## 2020-10-11 DIAGNOSIS — I10 Essential (primary) hypertension: Secondary | ICD-10-CM

## 2020-10-11 NOTE — Telephone Encounter (Signed)
Patient called to request a refill on their medication, they were instructed to hang up and contact their pharmacy directly to order their prescriptions even if they are out of refills.  

## 2020-10-29 ENCOUNTER — Ambulatory Visit (INDEPENDENT_AMBULATORY_CARE_PROVIDER_SITE_OTHER): Payer: Medicare Other | Admitting: Internal Medicine

## 2020-10-29 ENCOUNTER — Other Ambulatory Visit: Payer: Self-pay

## 2020-10-29 ENCOUNTER — Encounter: Payer: Self-pay | Admitting: Internal Medicine

## 2020-10-29 VITALS — BP 162/78 | HR 78 | Temp 97.5°F | Resp 17 | Wt 173.0 lb

## 2020-10-29 DIAGNOSIS — I10 Essential (primary) hypertension: Secondary | ICD-10-CM

## 2020-10-29 DIAGNOSIS — Z780 Asymptomatic menopausal state: Secondary | ICD-10-CM

## 2020-10-29 DIAGNOSIS — E119 Type 2 diabetes mellitus without complications: Secondary | ICD-10-CM

## 2020-10-29 DIAGNOSIS — E782 Mixed hyperlipidemia: Secondary | ICD-10-CM | POA: Diagnosis not present

## 2020-10-29 DIAGNOSIS — D509 Iron deficiency anemia, unspecified: Secondary | ICD-10-CM

## 2020-10-29 DIAGNOSIS — Z1231 Encounter for screening mammogram for malignant neoplasm of breast: Secondary | ICD-10-CM | POA: Diagnosis not present

## 2020-10-29 DIAGNOSIS — Z13820 Encounter for screening for osteoporosis: Secondary | ICD-10-CM

## 2020-10-29 LAB — POCT GLYCOSYLATED HEMOGLOBIN (HGB A1C): Hemoglobin A1C: 7.4 % — AB (ref 4.0–5.6)

## 2020-10-29 NOTE — Progress Notes (Signed)
Subjective:    Erin Estes - 74 y.o. female MRN [id number]  Date of birth: 1946/11/18  HPI  Erin Estes is here for follow up of chronic medical conditions.  Diabetes mellitus, Type 2 Disease Monitoring             Blood Sugar Ranges: Fasting - Previously 120s. Has not been checking recently as has been traveling.              Polyuria: no              Visual problems: no   Urine Microalbumin 12.6 (Jan 2020)   Last A1C: 7.1 (Jan 2021)   Medications: Metformin 1000 mg AM, 500 mg PM  Medication Compliance: yes Medication Side Effects             Hypoglycemia: no    Chronic HTN Disease Monitoring:  Home BP Monitoring - Does not monitor  Chest pain- no  Dyspnea- no Headache - no  Medications: Lisinopril 40 mg, Metoprolol XL 25 mg, Imdur 120 mg  Compliance- yes Lightheadedness- no  Edema- no           Health Maintenance:  Health Maintenance Due  Topic Date Due  . OPHTHALMOLOGY EXAM  Never done  . COVID-19 Vaccine (1) Never done  . COLONOSCOPY  Never done  . DEXA SCAN  Never done  . MAMMOGRAM  09/18/2016  . PNA vac Low Risk Adult (2 of 2 - PPSV23) 07/03/2020    -  reports that she has never smoked. She has never used smokeless tobacco. - Review of Systems: Per HPI. - Past Medical History: Patient Active Problem List   Diagnosis Date Noted  . Microcytic anemia 12/07/2019  . Diabetes mellitus (HCC) 12/13/2018  . Essential hypertension 12/13/2018  . Mixed hyperlipidemia 12/13/2018  . CAD, multiple vessel, 2 stent  12/13/2018  . HYPERTENSION 03/14/2009  . SCIATICA 03/14/2009   - Medications: reviewed and updated   Objective:   Physical Exam BP (!) 162/78   Pulse 78   Temp (!) 97.5 F (36.4 C) (Temporal)   Resp 17   Wt 78.5 kg   SpO2 96%   BMI 30.65 kg/m  Physical Exam Constitutional:      General: She is not in acute distress.    Appearance: She is not diaphoretic.  HENT:     Head: Normocephalic and atraumatic.  Eyes:      Extraocular Movements: EOM normal.     Conjunctiva/sclera: Conjunctivae normal.  Cardiovascular:     Rate and Rhythm: Normal rate and regular rhythm.     Heart sounds: Normal heart sounds. No murmur heard.   Pulmonary:     Effort: Pulmonary effort is normal. No respiratory distress.     Breath sounds: Normal breath sounds.  Musculoskeletal:        General: Normal range of motion.  Skin:    General: Skin is warm and dry.  Neurological:     Mental Status: She is alert and oriented to person, place, and time.  Psychiatric:        Mood and Affect: Affect normal.        Judgment: Judgment normal.            Assessment & Plan:   1. Controlled type 2 diabetes mellitus without complication, without long-term current use of insulin (HCC) A1c 7.4, still at goal of <8 for age. Continue Metformin.  Counseled on Diabetic diet, my plate method, 588 minutes of moderate intensity exercise/week Blood  sugar logs with fasting goals of 80-120 mg/dl, random of less than 333 and in the event of sugars less than 60 mg/dl or greater than 832 mg/dl encouraged to notify the clinic. Advised on the need for annual eye exams, annual foot exams, Pneumonia vaccine. - HgB A1c - Ambulatory referral to Ophthalmology - HM Diabetes Foot Exam - Comprehensive metabolic panel  2. Essential hypertension Patient's BP elevated, did not take any of her medications today. Return for visit when compliant with medications for more accurate assessment. For now, no adjustment to regimen. Asymptomatic.  - CBC with Differential - Comprehensive metabolic panel - LDL Cholesterol, Direct  3. Mixed hyperlipidemia Continue Lipitor. Monitor LDL.  - LDL Cholesterol, Direct  4. Breast cancer screening by mammogram - MM Digital Screening; Future  5. Osteoporosis screening - DG Bone Density; Future  6. Postmenopausal estrogen deficiency - DG Bone Density; Future  7. Microcytic anemia HgB 10.9 with MCV of 72 in Jan  2021. Prescribed Fe supplement for presumed iron deficiency anemia. Monitor. - CBC with Differential    Marcy Siren, D.O. 11/08/2020, 6:02 PM Primary Care at [facility]

## 2020-10-30 LAB — CBC WITH DIFFERENTIAL/PLATELET
Basophils Absolute: 0.1 10*3/uL (ref 0.0–0.2)
Basos: 1 %
EOS (ABSOLUTE): 0.1 10*3/uL (ref 0.0–0.4)
Eos: 1 %
Hematocrit: 36.2 % (ref 34.0–46.6)
Hemoglobin: 10.8 g/dL — ABNORMAL LOW (ref 11.1–15.9)
Immature Grans (Abs): 0 10*3/uL (ref 0.0–0.1)
Immature Granulocytes: 0 %
Lymphocytes Absolute: 2.6 10*3/uL (ref 0.7–3.1)
Lymphs: 31 %
MCH: 21.2 pg — ABNORMAL LOW (ref 26.6–33.0)
MCHC: 29.8 g/dL — ABNORMAL LOW (ref 31.5–35.7)
MCV: 71 fL — ABNORMAL LOW (ref 79–97)
Monocytes Absolute: 0.5 10*3/uL (ref 0.1–0.9)
Monocytes: 6 %
Neutrophils Absolute: 5.2 10*3/uL (ref 1.4–7.0)
Neutrophils: 61 %
Platelets: 274 10*3/uL (ref 150–450)
RBC: 5.1 x10E6/uL (ref 3.77–5.28)
RDW: 15.5 % — ABNORMAL HIGH (ref 11.7–15.4)
WBC: 8.5 10*3/uL (ref 3.4–10.8)

## 2020-10-30 LAB — COMPREHENSIVE METABOLIC PANEL
ALT: 16 IU/L (ref 0–32)
AST: 18 IU/L (ref 0–40)
Albumin/Globulin Ratio: 1.5 (ref 1.2–2.2)
Albumin: 4.3 g/dL (ref 3.7–4.7)
Alkaline Phosphatase: 103 IU/L (ref 44–121)
BUN/Creatinine Ratio: 15 (ref 12–28)
BUN: 15 mg/dL (ref 8–27)
Bilirubin Total: <0.2 mg/dL (ref 0.0–1.2)
CO2: 22 mmol/L (ref 20–29)
Calcium: 9.2 mg/dL (ref 8.7–10.3)
Chloride: 104 mmol/L (ref 96–106)
Creatinine, Ser: 0.99 mg/dL (ref 0.57–1.00)
GFR calc Af Amer: 66 mL/min/{1.73_m2} (ref >59)
GFR calc non Af Amer: 57 mL/min/{1.73_m2} — ABNORMAL LOW (ref >59)
Globulin, Total: 2.8 g/dL (ref 1.5–4.5)
Glucose: 152 mg/dL — ABNORMAL HIGH (ref 65–99)
Potassium: 4.2 mmol/L (ref 3.5–5.2)
Sodium: 140 mmol/L (ref 134–144)
Total Protein: 7.1 g/dL (ref 6.0–8.5)

## 2020-10-30 LAB — LDL CHOLESTEROL, DIRECT: LDL Direct: 171 mg/dL — ABNORMAL HIGH (ref 0–99)

## 2020-12-06 ENCOUNTER — Telehealth: Payer: Self-pay | Admitting: Internal Medicine

## 2020-12-06 DIAGNOSIS — E782 Mixed hyperlipidemia: Secondary | ICD-10-CM

## 2020-12-06 MED ORDER — ATORVASTATIN CALCIUM 80 MG PO TABS: ORAL_TABLET | ORAL | 1 refills | Status: DC

## 2020-12-06 MED ORDER — FERROUS SULFATE 324 (65 FE) MG PO TBEC
1.0000 | DELAYED_RELEASE_TABLET | Freq: Every day | ORAL | 5 refills | Status: DC
Start: 2020-12-06 — End: 2020-12-24

## 2020-12-06 NOTE — Telephone Encounter (Signed)
Pt presenting letter sent by CMA Montel Clock notifying to contact office for results from Dec 2021. Pt would like to know results with a spanish interpreter. Thank you.

## 2020-12-06 NOTE — Telephone Encounter (Signed)
Spoke w/ pt via [employer] [place], [id number]) and went over results from 10/29/20, pt reported that she has not been taking iron or atorvastatin, has been out for months, refills sent for both meds, advised pt to take consistently until next appt in March to ensure accurate lab readings at that time, pt verbalized understanding

## 2020-12-20 ENCOUNTER — Other Ambulatory Visit: Payer: Self-pay | Admitting: Internal Medicine

## 2020-12-20 DIAGNOSIS — I10 Essential (primary) hypertension: Secondary | ICD-10-CM

## 2020-12-20 DIAGNOSIS — E119 Type 2 diabetes mellitus without complications: Secondary | ICD-10-CM

## 2020-12-20 DIAGNOSIS — I2510 Atherosclerotic heart disease of native coronary artery without angina pectoris: Secondary | ICD-10-CM

## 2020-12-23 ENCOUNTER — Telehealth: Payer: Self-pay | Admitting: Internal Medicine

## 2020-12-23 DIAGNOSIS — E119 Type 2 diabetes mellitus without complications: Secondary | ICD-10-CM

## 2020-12-23 DIAGNOSIS — Z7982 Long term (current) use of aspirin: Secondary | ICD-10-CM

## 2020-12-23 DIAGNOSIS — I10 Essential (primary) hypertension: Secondary | ICD-10-CM

## 2020-12-23 DIAGNOSIS — E782 Mixed hyperlipidemia: Secondary | ICD-10-CM

## 2020-12-23 DIAGNOSIS — I2510 Atherosclerotic heart disease of native coronary artery without angina pectoris: Secondary | ICD-10-CM

## 2020-12-23 DIAGNOSIS — D509 Iron deficiency anemia, unspecified: Secondary | ICD-10-CM

## 2020-12-23 NOTE — Telephone Encounter (Signed)
Pt is in need of the following refills:  lisinopril (ZESTRIL) 40 MG tablet metoprolol succinate (TOPROL-XL) 25 MG 24 hr tablet   metFORMIN (GLUCOPHAGE) 500 MG tablet  atorvastatin (LIPITOR) 80 MG tablet  isosorbide mononitrate (IMDUR) 120 MG 24 hr tablet  ferrous sulfate 324 (65 Fe) MG TBEC   pantoprazole (PROTONIX) 40 MG tablet   [id number] - [place], [place] - [address] (Ph: [phone])  Please advise and thank you

## 2020-12-24 MED ORDER — PANTOPRAZOLE SODIUM 40 MG PO TBEC: 40.0000 mg | DELAYED_RELEASE_TABLET | Freq: Every day | ORAL | 11 refills | Status: DC

## 2020-12-24 MED ORDER — FERROUS SULFATE 324 (65 FE) MG PO TBEC: 1.0000 | DELAYED_RELEASE_TABLET | Freq: Every day | ORAL | 5 refills | Status: DC

## 2020-12-24 MED ORDER — LISINOPRIL 40 MG PO TABS: ORAL_TABLET | ORAL | 1 refills | Status: DC

## 2020-12-24 MED ORDER — ATORVASTATIN CALCIUM 80 MG PO TABS: ORAL_TABLET | ORAL | 1 refills | Status: DC

## 2020-12-24 MED ORDER — METOPROLOL SUCCINATE ER 25 MG PO TB24: 25.0000 mg | ORAL_TABLET | Freq: Every day | ORAL | 3 refills | Status: DC

## 2020-12-24 MED ORDER — ISOSORBIDE MONONITRATE ER 120 MG PO TB24: ORAL_TABLET | ORAL | 0 refills | Status: DC

## 2020-12-24 MED ORDER — METFORMIN HCL 500 MG PO TABS: ORAL_TABLET | ORAL | 3 refills | Status: DC

## 2020-12-24 NOTE — Telephone Encounter (Signed)
Refills sent

## 2021-01-28 ENCOUNTER — Ambulatory Visit: Payer: Medicare Other | Admitting: Internal Medicine

## 2021-01-31 ENCOUNTER — Other Ambulatory Visit: Payer: Self-pay

## 2021-01-31 ENCOUNTER — Ambulatory Visit (INDEPENDENT_AMBULATORY_CARE_PROVIDER_SITE_OTHER): Payer: Medicare Other | Admitting: Internal Medicine

## 2021-01-31 VITALS — BP 113/72 | HR 86 | Temp 97.3°F | Resp 16 | Wt 175.0 lb

## 2021-01-31 DIAGNOSIS — E1159 Type 2 diabetes mellitus with other circulatory complications: Secondary | ICD-10-CM

## 2021-01-31 DIAGNOSIS — D509 Iron deficiency anemia, unspecified: Secondary | ICD-10-CM

## 2021-01-31 DIAGNOSIS — R079 Chest pain, unspecified: Secondary | ICD-10-CM

## 2021-01-31 DIAGNOSIS — E782 Mixed hyperlipidemia: Secondary | ICD-10-CM

## 2021-01-31 DIAGNOSIS — I2510 Atherosclerotic heart disease of native coronary artery without angina pectoris: Secondary | ICD-10-CM

## 2021-01-31 LAB — POCT GLYCOSYLATED HEMOGLOBIN (HGB A1C): HbA1c, POC (controlled diabetic range): 7.4 % — AB (ref 0.0–7.0)

## 2021-01-31 LAB — GLUCOSE, POCT (MANUAL RESULT ENTRY): POC Glucose: 176 mg/dl — AB (ref 70–99)

## 2021-01-31 NOTE — Progress Notes (Signed)
Subjective:    Erin Estes - 75 y.o. female MRN [id number]  Date of birth: 03/15/1946  HPI  Erin Estes is here for chronic medical condition f/u. Also endorsing chest pain for 3 months with exertion.    Diabetes mellitus, Type 2 Disease Monitoring             Blood Sugar Ranges: 130-135 she reports--this is during daytime after eating. Forgot notebook at home.              Polyuria: no             Visual problems: no   Urine Microalbumin 12.6 (Jan 2020) --on Ace   Last A1C: 7.4 (Dec 2021)   Medications:  Metformin 1000 mg Am and 500 mg PM  Medication Compliance: yes  Medication Side Effects             Hypoglycemia: no      Health Maintenance:  Health Maintenance Due  Topic Date Due  . COVID-19 Vaccine (1) Never done  . OPHTHALMOLOGY EXAM  Never done  . COLONOSCOPY (Pts 45-86yrs Insurance coverage will need to be confirmed)  Never done  . DEXA SCAN  Never done  . MAMMOGRAM  09/18/2016  . PNA vac Low Risk Adult (2 of 2 - PPSV23) 07/03/2020    -  reports that she has never smoked. She has never used smokeless tobacco. - Review of Systems: Per HPI. - Past Medical History: Patient Active Problem List   Diagnosis Date Noted  . Microcytic anemia 12/07/2019  . Diabetes mellitus (HCC) 12/13/2018  . Essential hypertension 12/13/2018  . Mixed hyperlipidemia 12/13/2018  . CAD, multiple vessel, 2 stent  12/13/2018  . HYPERTENSION 03/14/2009  . SCIATICA 03/14/2009   - Medications: reviewed and updated   Objective:   Physical Exam BP 113/72   Pulse 86   Temp (!) 97.3 F (36.3 C)   Resp 16   Wt 175 lb (79.4 kg)   SpO2 96%   BMI 31.00 kg/m  Physical Exam Constitutional:      General: She is not in acute distress.    Appearance: She is not diaphoretic.  HENT:     Head: Normocephalic and atraumatic.  Eyes:     Conjunctiva/sclera: Conjunctivae normal.  Cardiovascular:     Rate and Rhythm: Normal rate and regular rhythm.     Heart sounds: Normal heart sounds. No  murmur heard.   Pulmonary:     Effort: Pulmonary effort is normal. No respiratory distress.     Breath sounds: Normal breath sounds.  Musculoskeletal:        General: Normal range of motion.  Skin:    General: Skin is warm and dry.  Neurological:     Mental Status: She is alert and oriented to person, place, and time.  Psychiatric:        Mood and Affect: Affect normal.        Judgment: Judgment normal.            Assessment & Plan:   1. Type 2 diabetes mellitus with other circulatory complication, without long-term current use of insulin (HCC) A1c at goal for age with result of 7.4%. Compliant with Metformin. No change in regimen.  - HgB A1c - Glucose (CBG) - Microalbumin / creatinine urine ratio  2. Mixed hyperlipidemia Compliant with Atorvastatin.  - Lipid panel - Comprehensive metabolic panel  3. Microcytic anemia Taking Fe supplement. Will monitor.  - CBC - Iron, TIBC and Ferritin Panel  4. Chest pain, unspecified type Concerning for stable angina. Is on Imdur. Seen by cardiology in 2020 and stress test was discussed due to h/o CAD with stent placement but she was apparently symptom free at that time. Recommended that if she were to have pain that she return for testing. Referral to re-establish placed.  - Ambulatory referral to Cardiology  5. CAD, multiple vessel, 2 stent  - Ambulatory referral to Cardiology     Marcy Siren, D.O. 01/31/2021, 10:59 AM Primary Care at [facility]

## 2021-01-31 NOTE — Progress Notes (Signed)
F/u DM Discuss medication  Chest pain- with movement and when she gets upset.                       Pain for over 3 mos. But noticed more frequently    [id number] A1C- 7.4

## 2021-02-01 LAB — LIPID PANEL
Chol/HDL Ratio: 4.1 ratio (ref 0.0–4.4)
Cholesterol, Total: 185 mg/dL (ref 100–199)
HDL: 45 mg/dL (ref >39)
LDL Chol Calc (NIH): 108 mg/dL — ABNORMAL HIGH (ref 0–99)
Triglycerides: 183 mg/dL — ABNORMAL HIGH (ref 0–149)
VLDL Cholesterol Cal: 32 mg/dL (ref 5–40)

## 2021-02-01 LAB — COMPREHENSIVE METABOLIC PANEL
ALT: 24 IU/L (ref 0–32)
AST: 19 IU/L (ref 0–40)
Albumin/Globulin Ratio: 1.6 (ref 1.2–2.2)
Albumin: 4.4 g/dL (ref 3.7–4.7)
Alkaline Phosphatase: 104 IU/L (ref 44–121)
BUN/Creatinine Ratio: 21 (ref 12–28)
BUN: 15 mg/dL (ref 8–27)
Bilirubin Total: 0.3 mg/dL (ref 0.0–1.2)
CO2: 21 mmol/L (ref 20–29)
Calcium: 9.5 mg/dL (ref 8.7–10.3)
Chloride: 104 mmol/L (ref 96–106)
Creatinine, Ser: 0.72 mg/dL (ref 0.57–1.00)
Globulin, Total: 2.8 g/dL (ref 1.5–4.5)
Glucose: 174 mg/dL — ABNORMAL HIGH (ref 65–99)
Potassium: 4.3 mmol/L (ref 3.5–5.2)
Sodium: 140 mmol/L (ref 134–144)
Total Protein: 7.2 g/dL (ref 6.0–8.5)
eGFR: 89 mL/min/{1.73_m2} (ref >59)

## 2021-02-01 LAB — CBC
Hematocrit: 36.1 % (ref 34.0–46.6)
Hemoglobin: 10.8 g/dL — ABNORMAL LOW (ref 11.1–15.9)
MCH: 21.4 pg — ABNORMAL LOW (ref 26.6–33.0)
MCHC: 29.9 g/dL — ABNORMAL LOW (ref 31.5–35.7)
MCV: 72 fL — ABNORMAL LOW (ref 79–97)
Platelets: 294 10*3/uL (ref 150–450)
RBC: 5.05 x10E6/uL (ref 3.77–5.28)
RDW: 17.2 % — ABNORMAL HIGH (ref 11.7–15.4)
WBC: 8 10*3/uL (ref 3.4–10.8)

## 2021-02-01 LAB — MICROALBUMIN / CREATININE URINE RATIO
Creatinine, Urine: 141.8 mg/dL
Microalb/Creat Ratio: 3 mg/g creat (ref 0–29)
Microalbumin, Urine: 4.6 ug/mL

## 2021-02-01 LAB — IRON,TIBC AND FERRITIN PANEL
Ferritin: 79 ng/mL (ref 15–150)
Iron Saturation: 37 % (ref 15–55)
Iron: 110 ug/dL (ref 27–139)
Total Iron Binding Capacity: 294 ug/dL (ref 250–450)
UIBC: 184 ug/dL (ref 118–369)

## 2021-02-03 ENCOUNTER — Other Ambulatory Visit: Payer: Self-pay | Admitting: Internal Medicine

## 2021-02-03 DIAGNOSIS — Z1211 Encounter for screening for malignant neoplasm of colon: Secondary | ICD-10-CM

## 2021-02-03 MED ORDER — EZETIMIBE 10 MG PO TABS: 10.0000 mg | ORAL_TABLET | Freq: Every day | ORAL | 3 refills | Status: DC

## 2021-02-05 ENCOUNTER — Ambulatory Visit: Payer: Medicare Other

## 2021-02-05 ENCOUNTER — Other Ambulatory Visit: Payer: Self-pay

## 2021-02-05 ENCOUNTER — Ambulatory Visit
Admission: RE | Admit: 2021-02-05 | Discharge: 2021-02-05 | Disposition: A | Discharge status: Home / Self Care | Payer: Medicare Other | Source: Ambulatory Visit | Attending: Internal Medicine | Admitting: Internal Medicine

## 2021-02-05 DIAGNOSIS — Z780 Asymptomatic menopausal state: Secondary | ICD-10-CM

## 2021-02-05 DIAGNOSIS — Z13820 Encounter for screening for osteoporosis: Secondary | ICD-10-CM

## 2021-02-10 ENCOUNTER — Ambulatory Visit: Payer: Medicare Other

## 2021-02-11 ENCOUNTER — Other Ambulatory Visit: Payer: Self-pay

## 2021-02-11 ENCOUNTER — Ambulatory Visit
Admission: RE | Admit: 2021-02-11 | Discharge: 2021-02-11 | Disposition: A | Discharge status: Home / Self Care | Payer: Medicare Other | Source: Ambulatory Visit | Attending: Internal Medicine | Admitting: Internal Medicine

## 2021-02-11 DIAGNOSIS — Z1231 Encounter for screening mammogram for malignant neoplasm of breast: Secondary | ICD-10-CM

## 2021-02-20 NOTE — Progress Notes (Signed)
Cardiology Office Note:    Date:  02/21/2021   ID:  Erin Estes, DOB 1946/04/03, MRN [id number]  PCP:  Nicolette Bang, DO  Cardiologist:  No primary care provider on file.  Electrophysiologist:  None   Referring MD: Caryl Never*   Chief Complaint/Reason for Referral: CAD, HTN  Visit completed with the assistance of the in person interpreter.  History of Present Illness:    Erin Estes is a 75 y.o. female with a history of hypertension, diabetes, and hyperlipidemia. She presents today to follow up for CAD and HTN, and for discussion of cardiovascular risk reduction.  In 2011 - 2 stents placed while living in [place] for chest pain, tells me she did not have an MI. She was going to have bypass surgery but they placed stents instead. In 2012 - one more stent placed while living in [place].  Today she has chest pain with exertion.  She feels the symptom may be longstanding but feels it changes with dose changes on medication.  Notes that perhaps at one point her metoprolol dose was decreased and since that occurred, she is experiencing more exertional angina.  She does quite a bit of physical activity, cleaning and walking outside.. CP with doing activities in the house. Goes out side when its cold and has chest pain. NO SOB and no COPD. Happens when she walks fast. No chest pain while resting. Does not have nitro.  She is a non-smoker.  The patient denies dyspnea at rest or with exertion, palpitations, PND, orthopnea, or leg swelling. Denies cough, fever, chills. Denies nausea, vomiting. Denies syncope or presyncope. Denies dizziness or lightheadedness.   Past Medical History:  Diagnosis Date  . CAD (coronary artery disease)   . Diabetes mellitus without complication ([place])   . Hyperlipidemia   . Hypertension     Past Surgical History:  Procedure Laterality Date  . CORONARY ANGIOPLASTY N/A   . HEMORRHOID SURGERY    . KNEE SURGERY Left     Current  Medications: Current Meds  Medication Sig  . aspirin 81 MG chewable tablet Chew 81 mg by mouth daily.  Marland Kitchen atorvastatin (LIPITOR) 80 MG tablet TOME UNA TABLETA TODOS LOS DIAS  . Blood Glucose Monitoring Suppl (ONETOUCH VERIO) w/Device KIT Use to check FSBS BID. Dx: E11.59  . ezetimibe (ZETIA) 10 MG tablet Take 1 tablet (10 mg total) by mouth daily.  . ferrous sulfate 324 (65 Fe) MG TBEC Take 1 tablet (325 mg total) by mouth daily.  Marland Kitchen glucose blood (ONETOUCH VERIO) test strip Use to check FSBS BID. Dx: E11.59.  . isosorbide mononitrate (IMDUR) 120 MG 24 hr tablet TOME UNA TABLETA TODOS LOS DIAS. DX: I10, I25.10  . Lancets (ONETOUCH ULTRASOFT) lancets Use to check FSBS BID. Dx: E11.59  . lisinopril (ZESTRIL) 40 MG tablet TOME UNA TABLETA TODOS LOS DIAS  . metFORMIN (GLUCOPHAGE) 500 MG tablet TAKE 2 TABLETS BY MOUTH WITH BREAKFAST AND TAKE 1 TABLET BY MOUTH WITH DINNER  . nitroGLYCERIN (NITROSTAT) 0.4 MG SL tablet Place 1 tablet (0.4 mg total) under the tongue every 5 (five) minutes as needed for chest pain.  . pantoprazole (PROTONIX) 40 MG tablet Take 1 tablet (40 mg total) by mouth daily. For stomach protection  . [DISCONTINUED] metoprolol succinate (TOPROL-XL) 25 MG 24 hr tablet Take 1 tablet (25 mg total) by mouth daily.     Allergies:   Patient has no known allergies.   Social History   Tobacco Use  . Smoking status:  Never Smoker  . Smokeless tobacco: Never Used  Substance Use Topics  . Alcohol use: Not Currently  . Drug use: Never     Family History: The patient's family history includes Hypertension in her sister and sister. There is no history of Stroke.  ROS:   Please see the history of present illness.    All other systems reviewed and are negative.  EKGs/Labs/Other Studies Reviewed:    The following studies were reviewed today:  EKG: Normal sinus rhythm, rate 71  Recent Labs: 01/31/2021: ALT 24; BUN 15; Creatinine, Ser 0.72; Hemoglobin 10.8; Platelets 294; Potassium  4.3; Sodium 140  Recent Lipid Panel    Component Value Date/Time   CHOL 185 01/31/2021 1139   TRIG 183 (H) 01/31/2021 1139   HDL 45 01/31/2021 1139   CHOLHDL 4.1 01/31/2021 1139   CHOLHDL 7.6 Ratio 03/14/2009 2319   VLDL 32 03/14/2009 2319   LDLCALC 108 (H) 01/31/2021 1139   LDLDIRECT 171 (H) 10/29/2020 1458    Physical Exam:    VS:  BP 140/82 (BP Location: Right Arm, Patient Position: Sitting, Cuff Size: Normal)   Pulse 71   Ht 5' 3"  (1.6 m)   Wt 180 lb 12.8 oz (82 kg)   SpO2 95%   BMI 32.03 kg/m     Wt Readings from Last 5 Encounters:  02/21/21 180 lb 12.8 oz (82 kg)  01/31/21 175 lb (79.4 kg)  10/29/20 173 lb (78.5 kg)  12/07/19 175 lb (79.4 kg)  09/04/19 174 lb (78.9 kg)    Constitutional: No acute distress Eyes: sclera non-icteric, normal conjunctiva and lids ENMT: normal dentition, moist mucous membranes Cardiovascular: regular rhythm, normal rate, no murmurs. S1 and S2 normal. Radial pulses normal bilaterally. No jugular venous distention.  Respiratory: clear to auscultation bilaterally GI : normal bowel sounds, soft and nontender. No distention.   MSK: extremities warm, well perfused. No edema.  NEURO: grossly nonfocal exam, moves all extremities. PSYCH: alert and oriented x 3, normal mood and affect.   ASSESSMENT:    1. Essential hypertension   2. CAD, multiple vessel, 2 stent    3. Mixed hyperlipidemia   4. Type 2 diabetes mellitus with other circulatory complication, without long-term current use of insulin ([place])   5. Precordial pain    PLAN:    Essential hypertension-blood pressure is mildly elevated today, I am up titrating her metoprolol, we will reassess blood pressure at her next follow-up.  Mixed hyperlipidemia-continues on atorvastatin 80 mg daily, LDL 108.  Triglycerides 183.  We will review results of stress testing and adjust therapy with more information.  Type 2 diabetes mellitus with other circulatory complication, without long-term  current use of insulin ([facility])  Precordial pain  CAD, multiple vessel, 2 stent  -Prior history of PCI now with evidence of stable angina.  She was not symptomatic at her last visit and now is describing chest pain with activity.  I think we need to restratify with a nuclear stress test.  Since this is exertional symptoms, would prefer treadmill stress test with a normal resting ECG.  If suboptimal exercise which she feels may happen, convert to [employer].  We will follow up right after stress test results are available.  If high risk, will consider coronary angiography.  Total time of encounter: 30 minutes total time of encounter, including 25 minutes spent in face-to-face patient care on the date of this encounter. This time includes coordination of care and counseling regarding above mentioned problem list. Remainder  of non-face-to-face time involved reviewing chart documents/testing relevant to the patient encounter and documentation in the medical record. I have independently reviewed documentation from referring provider.   Cherlynn Kaiser, MD, [facility]    Medication Adjustments/Labs and Tests Ordered: Current medicines are reviewed at length with the patient today.  Concerns regarding medicines are outlined above.   Orders Placed This Encounter  Procedures  . MYOCARDIAL PERFUSION IMAGING  . EKG 12-Lead    Shared Decision Making/Informed Consent:   Shared Decision Making/Informed Consent The risks [chest pain, shortness of breath, cardiac arrhythmias, dizziness, blood pressure fluctuations, myocardial infarction, stroke/transient ischemic attack, nausea, vomiting, allergic reaction, radiation exposure, metallic taste sensation and life-threatening complications (estimated to be 1 in 10,000)], benefits (risk stratification, diagnosing coronary artery disease, treatment guidance) and alternatives of a nuclear stress test were discussed in detail with Ms.  Dueitt and she agrees to proceed.    Meds ordered this encounter  Medications  . nitroGLYCERIN (NITROSTAT) 0.4 MG SL tablet    Sig: Place 1 tablet (0.4 mg total) under the tongue every 5 (five) minutes as needed for chest pain.    Dispense:  25 tablet    Refill:  3  . metoprolol succinate (TOPROL-XL) 50 MG 24 hr tablet    Sig: Take 1 tablet (50 mg total) by mouth daily.    Dispense:  30 tablet    Refill:  3    DOSE CHANGE. INCREASE TO 68m DAILY    Patient Instructions  Medication Instructions:  INCREASE METOPROLOL SUCCINATE TO 50MG (1) TABLET DAILY- PLEASE PICK UP NEW PRESCRIPTION FOR THIS AT YOUR PHARMACY  NITROGLYCERIN- MAY TAKE (1) TABLET AS NEEDED FOR CHEST PAIN EVERY 5 MINUTES FOR UP TO 3 DOSES. IF CHEST PAIN IS NOT RELIEVED AFTER FIRST DOSE OF NITRO-PLEASE TAKE SECOND DOSE OF NITRO AND REPORT TO THE EMERGENCY ROOM. DO NOT TAKE MORE THAN 3 DOSES IN 15 MINUTES. PLEASE SEE INFORMATION SHEET ATTACHED FOR FURTHER INFORMATION ON NITROGLYCERIN  *If you need a refill on your cardiac medications before your next appointment, please call your pharmacy*  Lab Work: COVID TEST PRIOR TO STRESS TEST If you have labs (blood work) drawn today and your tests are completely normal, you will receive your results only by: .Marland KitchenMyChart Message (if you have MyChart) OR . A paper copy in the mail If you have any lab test that is abnormal or we need to change your treatment, we will call you to review the results.  Testing/Procedures: Your physician has requested that you have en exercise stress myoview. [facility] OR FIRST AVAILABLE. For further information please visit [email or web address] Please follow instruction sheet, as given.  The test will take approximately 3 to 4 hours to complete; you may bring reading material.  If someone comes with you to your appointment, they will need to remain in the main lobby due to limited space in the testing area.    How to  prepare for your Myocardial Perfusion Test:  Do not eat or drink 3 hours prior to your test, except you may have water.  Do not consume products containing caffeine (regular or decaffeinated) 12 hours prior to your test. (ex: coffee, chocolate, sodas, tea).  Do wear comfortable clothes (no dresses or overalls) and walking shoes, tennis shoes preferred (No heels or open toe shoes are allowed).  Do NOT wear cologne, perfume, aftershave, or lotions (deodorant is allowed).  If you use an inhaler, use  it the AM of your test and bring it with you.   If you use a nebulizer, use it the AM of your test.   If these instructions are not followed, your test will have to be rescheduled.   Follow-Up: At [facility], you and your health needs are our priority.  As part of our continuing mission to provide you with exceptional heart care, we have created designated Provider Care Teams.  These Care Teams include your primary Cardiologist (physician) and Advanced Practice Providers (APPs -  Physician Assistants and Nurse Practitioners) who all work together to provide you with the care you need, when you need it.  We recommend signing up for the patient portal called "MyChart".  Sign up information is provided on this After Visit Summary.  MyChart is used to connect with patients for Virtual Visits (Telemedicine).  Patients are able to view lab/test results, encounter notes, upcoming appointments, etc.  Non-urgent messages can be sent to your provider as well.   To learn more about what you can do with MyChart, go to [email or web address].    Your next appointment:   AFTER TESTING  The format for your next appointment:   In Person  Provider:   Cherlynn Kaiser, MD  Other Instructions Nitroglycerin sublingual tablets What is this medicine? NITROGLYCERIN (nye troe GLI ser in) is a type of vasodilator. It relaxes blood vessels, increasing the blood and oxygen supply to your heart. This medicine is  used to relieve chest pain caused by angina. It is also used to prevent chest pain before activities like climbing stairs, going outdoors in cold weather, or sexual activity. This medicine may be used for other purposes; ask your health care provider or pharmacist if you have questions. COMMON BRAND NAME(S): Nitroquick, Nitrostat, Nitrotab What should I tell my health care provider before I take this medicine? They need to know if you have any of these conditions:  anemia  head injury, recent stroke, or bleeding in the brain  liver disease  previous heart attack  an unusual or allergic reaction to nitroglycerin, other medicines, foods, dyes, or preservatives  pregnant or trying to get pregnant  breast-feeding How should I use this medicine? Take this medicine by mouth as needed. Use at the first sign of an angina attack (chest pain or tightness). You can also take this medicine 5 to 10 minutes before an event likely to produce chest pain. Follow the directions exactly as written on the prescription label. Place one tablet under your tongue and let it dissolve. Do not swallow whole. Replace the dose if you accidentally swallow it. It will help if your mouth is not dry. Saliva around the tablet will help it to dissolve more quickly. Do not eat or drink, smoke or chew tobacco while a tablet is dissolving. Sit down when taking this medicine. In an angina attack, you should feel better within 5 minutes after your first dose. You can take a dose every 5 minutes up to a total of 3 doses. If you do not feel better or feel worse after 1 dose, call 9-1-1 at once. Do not take more than 3 doses in 15 minutes. Your health care provider might give you other directions. Follow those directions if he or she does. Do not take your medicine more often than directed. Talk to your health care provider about the use of this medicine in children. Special care may be needed. Overdosage: If you think you have taken  too much of this  medicine contact a poison control center or emergency room at once. NOTE: This medicine is only for you. Do not share this medicine with others. What if I miss a dose? This does not apply. This medicine is only used as needed. What may interact with this medicine? Do not take this medicine with any of the following medications:  certain migraine medicines like ergotamine and dihydroergotamine (DHE)  medicines used to treat erectile dysfunction like sildenafil, tadalafil, and vardenafil  riociguat This medicine may also interact with the following medications:  alteplase  aspirin  heparin  medicines for high blood pressure  medicines for mental depression  other medicines used to treat angina  phenothiazines like chlorpromazine, mesoridazine, prochlorperazine, thioridazine This list may not describe all possible interactions. Give your health care provider a list of all the medicines, herbs, non-prescription drugs, or dietary supplements you use. Also tell them if you smoke, drink alcohol, or use illegal drugs. Some items may interact with your medicine. What should I watch for while using this medicine? Tell your doctor or health care professional if you feel your medicine is no longer working. Keep this medicine with you at all times. Sit or lie down when you take your medicine to prevent falling if you feel dizzy or faint after using it. Try to remain calm. This will help you to feel better faster. If you feel dizzy, take several deep breaths and lie down with your feet propped up, or bend forward with your head resting between your knees. You may get drowsy or dizzy. Do not drive, use machinery, or do anything that needs mental alertness until you know how this drug affects you. Do not stand or sit up quickly, especially if you are an older patient. This reduces the risk of dizzy or fainting spells. Alcohol can make you more drowsy and dizzy. Avoid alcoholic  drinks. Do not treat yourself for coughs, colds, or pain while you are taking this medicine without asking your doctor or health care professional for advice. Some ingredients may increase your blood pressure. What side effects may I notice from receiving this medicine? Side effects that you should report to your doctor or health care professional as soon as possible:  allergic reactions (skin rash, itching or hives; swelling of the face, lips, or tongue)  low blood pressure (dizziness; feeling faint or lightheaded, falls; unusually weak or tired)  low red blood cell counts (trouble breathing; feeling faint; lightheaded, falls; unusually weak or tired) Side effects that usually do not require medical attention (report to your doctor or health care professional if they continue or are bothersome):  facial flushing (redness)  headache  nausea, vomiting This list may not describe all possible side effects. Call your doctor for medical advice about side effects. You may report side effects to FDA at 1-800-FDA-1088. Where should I keep my medicine? Keep out of the reach of children. Store at room temperature between 20 and 25 degrees C (68 and 77 degrees F). Store in [employer]. Protect from light and moisture. Keep tightly closed. Throw away any unused medicine after the expiration date. NOTE: This sheet is a summary. It may not cover all possible information. If you have questions about this medicine, talk to your doctor, pharmacist, or health care provider.  2021 Elsevier/Gold Standard (2018-08-10 16:46:32)

## 2021-02-21 ENCOUNTER — Ambulatory Visit (INDEPENDENT_AMBULATORY_CARE_PROVIDER_SITE_OTHER): Payer: Medicare Other | Admitting: Internal Medicine

## 2021-02-21 ENCOUNTER — Encounter: Payer: Self-pay | Admitting: Internal Medicine

## 2021-02-21 ENCOUNTER — Other Ambulatory Visit: Payer: Self-pay

## 2021-02-21 VITALS — BP 140/82 | HR 71 | Ht 63.0 in | Wt 180.8 lb

## 2021-02-21 DIAGNOSIS — E782 Mixed hyperlipidemia: Secondary | ICD-10-CM | POA: Diagnosis not present

## 2021-02-21 DIAGNOSIS — I10 Essential (primary) hypertension: Secondary | ICD-10-CM | POA: Diagnosis not present

## 2021-02-21 DIAGNOSIS — E1159 Type 2 diabetes mellitus with other circulatory complications: Secondary | ICD-10-CM

## 2021-02-21 DIAGNOSIS — R072 Precordial pain: Secondary | ICD-10-CM

## 2021-02-21 DIAGNOSIS — I2510 Atherosclerotic heart disease of native coronary artery without angina pectoris: Secondary | ICD-10-CM

## 2021-02-21 MED ORDER — NITROGLYCERIN 0.4 MG SL SUBL: 0.4000 mg | SUBLINGUAL_TABLET | SUBLINGUAL | 3 refills | Status: DC | PRN

## 2021-02-21 MED ORDER — METOPROLOL SUCCINATE ER 50 MG PO TB24: 50.0000 mg | ORAL_TABLET | Freq: Every day | ORAL | 3 refills | Status: DC

## 2021-02-21 NOTE — Patient Instructions (Addendum)
Medication Instructions:  INCREASE METOPROLOL SUCCINATE TO 50MG  (1) TABLET DAILY- PLEASE PICK UP NEW PRESCRIPTION FOR THIS AT YOUR PHARMACY  NITROGLYCERIN- MAY TAKE (1) TABLET AS NEEDED FOR CHEST PAIN EVERY 5 MINUTES FOR UP TO 3 DOSES. IF CHEST PAIN IS NOT RELIEVED AFTER FIRST DOSE OF NITRO-PLEASE TAKE SECOND DOSE OF NITRO AND REPORT TO THE EMERGENCY ROOM. DO NOT TAKE MORE THAN 3 DOSES IN 15 MINUTES. PLEASE SEE INFORMATION SHEET ATTACHED FOR FURTHER INFORMATION ON NITROGLYCERIN  *If you need a refill on your cardiac medications before your next appointment, please call your pharmacy*  Lab Work: COVID TEST PRIOR TO STRESS TEST If you have labs (blood work) drawn today and your tests are completely normal, you will receive your results only by: MyChart Message (if you have MyChart) OR . A paper copy in the mail If you have any lab test that is abnormal or we need to change your treatment, we will call you to review the results.  Testing/Procedures: Your physician has requested that you have en exercise stress myoview. PLEASE SCHEDULE AT [facility] April 13-15 TH OR FIRST AVAILABLE. For further information please visit [phone]. Please follow instruction sheet, as given.  The test will take approximately 3 to 4 hours to complete; you may bring reading material.  If someone comes with you to your appointment, they will need to remain in the main lobby due to limited space in the testing area.    How to prepare for your Myocardial Perfusion Test:  Do not eat or drink 3 hours prior to your test, except you may have water.  Do not consume products containing caffeine (regular or decaffeinated) 12 hours prior to your test. (ex: coffee, chocolate, sodas, tea).  Do wear comfortable clothes (no dresses or overalls) and walking shoes, tennis shoes preferred (No heels or open toe shoes are allowed).  Do NOT wear cologne, perfume, aftershave, or lotions (deodorant is allowed).  If you use  an inhaler, use it the AM of your test and bring it with you.   If you use a nebulizer, use it the AM of your test.   If these instructions are not followed, your test will have to be rescheduled.   Follow-Up: At [facility], you and your health needs are our priority.  As part of our continuing mission to provide you with exceptional heart care, we have created designated Provider Care Teams.  These Care Teams include your primary Cardiologist (physician) and Advanced Practice Providers (APPs -  Physician Assistants and Nurse Practitioners) who all work together to provide you with the care you need, when you need it.  We recommend signing up for the patient portal called "MyChart".  Sign up information is provided on this After Visit Summary.  MyChart is used to connect with patients for Virtual Visits (Telemedicine).  Patients are able to view lab/test results, encounter notes, upcoming appointments, etc.  Non-urgent messages can be sent to your provider as well.   To learn more about what you can do with MyChart, go to [facility].    Your next appointment:   AFTER TESTING  The format for your next appointment:   In Person  Provider:   [email or web address], MD  Other Instructions Nitroglycerin sublingual tablets What is this medicine? NITROGLYCERIN (nye troe GLI ser in) is a type of vasodilator. It relaxes blood vessels, increasing the blood and oxygen supply to your heart. This medicine is used to relieve chest pain caused by angina. It is  also used to prevent chest pain before activities like climbing stairs, going outdoors in cold weather, or sexual activity. This medicine may be used for other purposes; ask your health care provider or pharmacist if you have questions. COMMON BRAND NAME(S): Nitroquick, Nitrostat, Nitrotab What should I tell my health care provider before I take this medicine? They need to know if you have any of these conditions:  anemia  head  injury, recent stroke, or bleeding in the brain  liver disease  previous heart attack  an unusual or allergic reaction to nitroglycerin, other medicines, foods, dyes, or preservatives  pregnant or trying to get pregnant  breast-feeding How should I use this medicine? Take this medicine by mouth as needed. Use at the first sign of an angina attack (chest pain or tightness). You can also take this medicine 5 to 10 minutes before an event likely to produce chest pain. Follow the directions exactly as written on the prescription label. Place one tablet under your tongue and let it dissolve. Do not swallow whole. Replace the dose if you accidentally swallow it. It will help if your mouth is not dry. Saliva around the tablet will help it to dissolve more quickly. Do not eat or drink, smoke or chew tobacco while a tablet is dissolving. Sit down when taking this medicine. In an angina attack, you should feel better within 5 minutes after your first dose. You can take a dose every 5 minutes up to a total of 3 doses. If you do not feel better or feel worse after 1 dose, call 9-1-1 at once. Do not take more than 3 doses in 15 minutes. Your health care provider might give you other directions. Follow those directions if he or she does. Do not take your medicine more often than directed. Talk to your health care provider about the use of this medicine in children. Special care may be needed. Overdosage: If you think you have taken too much of this medicine contact a poison control center or emergency room at once. NOTE: This medicine is only for you. Do not share this medicine with others. What if I miss a dose? This does not apply. This medicine is only used as needed. What may interact with this medicine? Do not take this medicine with any of the following medications:  certain migraine medicines like ergotamine and dihydroergotamine (DHE)  medicines used to treat erectile dysfunction like sildenafil,  tadalafil, and vardenafil  riociguat This medicine may also interact with the following medications:  alteplase  aspirin  heparin  medicines for high blood pressure  medicines for mental depression  other medicines used to treat angina  phenothiazines like chlorpromazine, mesoridazine, prochlorperazine, thioridazine This list may not describe all possible interactions. Give your health care provider a list of all the medicines, herbs, non-prescription drugs, or dietary supplements you use. Also tell them if you smoke, drink alcohol, or use illegal drugs. Some items may interact with your medicine. What should I watch for while using this medicine? Tell your doctor or health care professional if you feel your medicine is no longer working. Keep this medicine with you at all times. Sit or lie down when you take your medicine to prevent falling if you feel dizzy or faint after using it. Try to remain calm. This will help you to feel better faster. If you feel dizzy, take several deep breaths and lie down with your feet propped up, or bend forward with your head resting between your knees.  You may get drowsy or dizzy. Do not drive, use machinery, or do anything that needs mental alertness until you know how this drug affects you. Do not stand or sit up quickly, especially if you are an older patient. This reduces the risk of dizzy or fainting spells. Alcohol can make you more drowsy and dizzy. Avoid alcoholic drinks. Do not treat yourself for coughs, colds, or pain while you are taking this medicine without asking your doctor or health care professional for advice. Some ingredients may increase your blood pressure. What side effects may I notice from receiving this medicine? Side effects that you should report to your doctor or health care professional as soon as possible:  allergic reactions (skin rash, itching or hives; swelling of the face, lips, or tongue)  low blood pressure (dizziness;  feeling faint or lightheaded, falls; unusually weak or tired)  low red blood cell counts (trouble breathing; feeling faint; lightheaded, falls; unusually weak or tired) Side effects that usually do not require medical attention (report to your doctor or health care professional if they continue or are bothersome):  facial flushing (redness)  headache  nausea, vomiting This list may not describe all possible side effects. Call your doctor for medical advice about side effects. You may report side effects to FDA at 1-800-FDA-1088. Where should I keep my medicine? Keep out of the reach of children. Store at room temperature between 20 and 25 degrees C (68 and 77 degrees F). Store in Retail buyer. Protect from light and moisture. Keep tightly closed. Throw away any unused medicine after the expiration date. NOTE: This sheet is a summary. It may not cover all possible information. If you have questions about this medicine, talk to your doctor, pharmacist, or health care provider.  2021 Elsevier/Gold Standard (2018-08-10 16:46:32)

## 2021-03-05 ENCOUNTER — Encounter (HOSPITAL_COMMUNITY): Payer: Medicare Other

## 2021-03-05 ENCOUNTER — Other Ambulatory Visit (HOSPITAL_COMMUNITY)
Admission: RE | Admit: 2021-03-05 | Discharge: 2021-03-05 | Disposition: A | Discharge status: Home / Self Care | Payer: Medicare Other | Source: Ambulatory Visit | Attending: Internal Medicine | Admitting: Internal Medicine

## 2021-03-05 DIAGNOSIS — Z20822 Contact with and (suspected) exposure to covid-19: Secondary | ICD-10-CM | POA: Insufficient documentation

## 2021-03-05 DIAGNOSIS — Z01812 Encounter for preprocedural laboratory examination: Secondary | ICD-10-CM | POA: Insufficient documentation

## 2021-03-05 LAB — SARS CORONAVIRUS 2 (TAT 6-24 HRS): SARS Coronavirus 2: NEGATIVE

## 2021-03-06 ENCOUNTER — Telehealth (HOSPITAL_COMMUNITY): Payer: Self-pay | Admitting: *Deleted

## 2021-03-06 NOTE — Telephone Encounter (Signed)
Via interpreter [phone] - Patient given detailed instructions per Myocardial Perfusion Study Information Sheet for the test on 03/07/21 at 10:15. Patient notified to arrive 15 minutes early and that it is imperative to arrive on time for appointment to keep from having the test rescheduled.  If you need to cancel or reschedule your appointment, please call the office within 24 hours of your appointment. . Patient verbalized understanding.Daneil Dolin

## 2021-03-07 ENCOUNTER — Ambulatory Visit (HOSPITAL_COMMUNITY): Payer: Managed Care, Other (non HMO) | Attending: Internal Medicine

## 2021-03-07 ENCOUNTER — Other Ambulatory Visit: Payer: Self-pay

## 2021-03-07 DIAGNOSIS — R072 Precordial pain: Secondary | ICD-10-CM | POA: Insufficient documentation

## 2021-03-07 LAB — MYOCARDIAL PERFUSION IMAGING
Estimated workload: 4.6 METS
Exercise duration (min): 4 min
Exercise duration (sec): 0 s
LV dias vol: 31 mL (ref 46–106)
LV sys vol: 9 mL
MPHR: 148 {beats}/min
Peak HR: 141 {beats}/min
Percent HR: 95 %
Rest HR: 108 {beats}/min
SDS: 1
SRS: 2
SSS: 3
TID: 1.12

## 2021-03-07 MED ORDER — TECHNETIUM TC 99M TETROFOSMIN IV KIT
10.2000 | PACK | Freq: Once | INTRAVENOUS | Status: AC | PRN
  Administered 2021-03-07: 10.2 via INTRAVENOUS
  Filled 2021-03-07: qty 11

## 2021-03-07 MED ORDER — TECHNETIUM TC 99M TETROFOSMIN IV KIT
31.9000 | PACK | Freq: Once | INTRAVENOUS | Status: AC | PRN
  Administered 2021-03-07: 31.9 via INTRAVENOUS
  Filled 2021-03-07: qty 32

## 2021-03-16 ENCOUNTER — Other Ambulatory Visit: Payer: Self-pay | Admitting: Internal Medicine

## 2021-03-16 DIAGNOSIS — I2510 Atherosclerotic heart disease of native coronary artery without angina pectoris: Secondary | ICD-10-CM

## 2021-03-16 DIAGNOSIS — I10 Essential (primary) hypertension: Secondary | ICD-10-CM

## 2021-04-08 ENCOUNTER — Telehealth: Payer: Self-pay | Admitting: Internal Medicine

## 2021-04-08 DIAGNOSIS — E782 Mixed hyperlipidemia: Secondary | ICD-10-CM

## 2021-04-08 DIAGNOSIS — E119 Type 2 diabetes mellitus without complications: Secondary | ICD-10-CM

## 2021-04-08 DIAGNOSIS — D509 Iron deficiency anemia, unspecified: Secondary | ICD-10-CM

## 2021-04-08 DIAGNOSIS — I10 Essential (primary) hypertension: Secondary | ICD-10-CM

## 2021-04-08 DIAGNOSIS — Z7982 Long term (current) use of aspirin: Secondary | ICD-10-CM

## 2021-04-08 NOTE — Telephone Encounter (Signed)
  atorvastatin (LIPITOR) 80 MG tablet  nitroGLYCERIN (NITROSTAT) 0.4 MG SL tablet lisinopril (ZESTRIL) 40 MG tablet  ferrous sulfate 324 (65 Fe) MG TBEC metoprolol succinate (TOPROL-XL) 50 MG 24 hr tablet  metFORMIN (GLUCOPHAGE) 500 MG tablet isosorbide mononitrate (IMDUR) 120 MG 24 hr tablet  Pharmacy  [id number] [place], [place] - [address]  [address] [place] [address]  Phone:  [phone] Fax:  [phone]  DEA #:  [id number]

## 2021-04-09 MED ORDER — FERROUS SULFATE 324 (65 FE) MG PO TBEC: 1.0000 | DELAYED_RELEASE_TABLET | Freq: Every day | ORAL | 5 refills | Status: AC

## 2021-04-09 MED ORDER — LISINOPRIL 40 MG PO TABS: ORAL_TABLET | ORAL | 1 refills | Status: DC

## 2021-04-09 MED ORDER — METFORMIN HCL 500 MG PO TABS: ORAL_TABLET | ORAL | 2 refills | Status: DC

## 2021-04-09 NOTE — Addendum Note (Signed)
Addended by: Guy Franco on: 04/09/2021 04:44 PM   Modules accepted: Orders

## 2021-04-09 NOTE — Telephone Encounter (Signed)
rx sent to pharmacy for metformin and  Fe Supplement Will need to call cardiology office for metoprolol and NTG refill.

## 2021-04-09 NOTE — Telephone Encounter (Signed)
Pt stated she still needs these meds called in.  atorvastatin (LIPITOR) 80 MG tablet  isosorbide mononitrate (IMDUR) 120 MG 24 hr tablet  Pharmacy  [id number] [place], [place] - [address]  [address] [place] [address]  Phone:  [phone] Fax:  [phone]  DEA #:  [id number]

## 2021-04-10 NOTE — Telephone Encounter (Signed)
Attempted to call patient x2, no VM set up to leave a message. Was calling to advise patient that to get refills of her atorvastatin and nitroglycerin she needs to contact cardiology since that is the office that prescribed them.

## 2021-04-11 ENCOUNTER — Encounter: Payer: Self-pay | Admitting: Internal Medicine

## 2021-04-11 ENCOUNTER — Ambulatory Visit (INDEPENDENT_AMBULATORY_CARE_PROVIDER_SITE_OTHER): Payer: Medicare Other | Admitting: Internal Medicine

## 2021-04-11 ENCOUNTER — Other Ambulatory Visit: Payer: Self-pay

## 2021-04-11 VITALS — BP 138/72 | HR 78 | Ht 61.0 in | Wt 180.2 lb

## 2021-04-11 DIAGNOSIS — I10 Essential (primary) hypertension: Secondary | ICD-10-CM

## 2021-04-11 DIAGNOSIS — Z79899 Other long term (current) drug therapy: Secondary | ICD-10-CM

## 2021-04-11 DIAGNOSIS — I2510 Atherosclerotic heart disease of native coronary artery without angina pectoris: Secondary | ICD-10-CM | POA: Diagnosis not present

## 2021-04-11 DIAGNOSIS — E782 Mixed hyperlipidemia: Secondary | ICD-10-CM | POA: Diagnosis not present

## 2021-04-11 DIAGNOSIS — E119 Type 2 diabetes mellitus without complications: Secondary | ICD-10-CM

## 2021-04-11 MED ORDER — EZETIMIBE 10 MG PO TABS
10.0000 mg | ORAL_TABLET | Freq: Every day | ORAL | 3 refills | Status: AC
Start: 2021-04-11 — End: ?

## 2021-04-11 MED ORDER — ISOSORBIDE MONONITRATE ER 120 MG PO TB24: ORAL_TABLET | ORAL | 3 refills | Status: AC

## 2021-04-11 MED ORDER — NITROGLYCERIN 0.4 MG SL SUBL: 0.4000 mg | SUBLINGUAL_TABLET | SUBLINGUAL | 3 refills | Status: AC | PRN

## 2021-04-11 MED ORDER — ATORVASTATIN CALCIUM 80 MG PO TABS: ORAL_TABLET | ORAL | 1 refills | Status: AC

## 2021-04-11 MED ORDER — LISINOPRIL 40 MG PO TABS: ORAL_TABLET | ORAL | 1 refills | Status: AC

## 2021-04-11 MED ORDER — METOPROLOL SUCCINATE ER 50 MG PO TB24: 75.0000 mg | ORAL_TABLET | Freq: Every day | ORAL | 3 refills | Status: AC

## 2021-04-11 NOTE — Addendum Note (Signed)
Addended by: Bea Laura B on: 04/11/2021 04:09 PM   Modules accepted: Orders

## 2021-04-11 NOTE — Telephone Encounter (Signed)
All cardiac medications refilled during OV today with Dr. Jacques Navy.

## 2021-04-11 NOTE — Patient Instructions (Signed)
Medication Instructions:  INCREASE METOPROLOL SUCCINATE TO 75mg  DAILY (1 and 1/2 Tablets) CARDIAC MEDICATIONS HAVE BEEN REFILLED  *If you need a refill on your cardiac medications before your next appointment, please call your pharmacy*  Follow-Up: At [facility], you and your health needs are our priority.  As part of our continuing mission to provide you with exceptional heart care, we have created designated Provider Care Teams.  These Care Teams include your primary Cardiologist (physician) and Advanced Practice Providers (APPs -  Physician Assistants and Nurse Practitioners) who all work together to provide you with the care you need, when you need it.  We recommend signing up for the patient portal called "MyChart".  Sign up information is provided on this After Visit Summary.  MyChart is used to connect with patients for Virtual Visits (Telemedicine).  Patients are able to view lab/test results, encounter notes, upcoming appointments, etc.  Non-urgent messages can be sent to your provider as well.   To learn more about what you can do with MyChart, go to [facility].    Your next appointment:   3 month(s)  The format for your next appointment:   In Person  Provider:   [email or web address], MD

## 2021-04-11 NOTE — Progress Notes (Addendum)
Cardiology Office Note:    Date:  04/11/2021   ID:  Erin Estes, DOB 1945-12-28, MRN [id number]  PCP:  Nicolette Bang, DO  Cardiologist:  Elouise Munroe, MD  Electrophysiologist:  None   Referring MD: Caryl Never*   Chief Complaint/Reason for Referral: CAD, HTN  Visit completed with the assistance of the in person interpreter.  History of Present Illness:    Erin Estes is a 75 y.o. female with a history of hypertension, diabetes, and hyperlipidemia. She presents today to follow up for CAD and HTN, and for discussion of cardiovascular risk reduction.  In 2011 - 2 stents placed while living in [place] for chest pain, tells me she did not have an MI. She was going to have bypass surgery but they placed stents instead. In 2012 - one more stent placed while living in [place].  04/11/2021 Today she is feeling well overall. Since her stress test, she has noticed some improvement in her symptoms, and her chest pains are not as frequent, likely due to beta-blocker titration.  She feels further titration would be warranted.  She denies any shortness of breath, palpitations, or exertional symptoms. No headaches, lightheadedness, or syncope to report. Also has no lower extremity edema, orthopnea or PND.  She had a low risk nuclear stress test, however she had a low workload which reduces the sensitivity of the test to detect ischemia.  We discussed this in detail today.  If chest pain remains well controlled with medical management, no urgent need to repeat stress testing.  She will let me know if she has recurrent chest pain.  Atorvastatin was first added to her regimen in 2011. For cholesterol, she does not remember when ezetimibe was added but appears to have been added in March, likely by PCP.  Lipids are still with suboptimal control with LDL of 108 and triglycerides of 183, I think these were obtained prior to starting Zetia.  Would consider repeat lipids in 3  months if not coordinated by PCP.  Past Medical History:  Diagnosis Date  . CAD (coronary artery disease)   . Diabetes mellitus without complication ([place])   . Hyperlipidemia   . Hypertension     Past Surgical History:  Procedure Laterality Date  . CORONARY ANGIOPLASTY N/A   . HEMORRHOID SURGERY    . KNEE SURGERY Left     Current Medications: Current Meds  Medication Sig  . aspirin 81 MG chewable tablet Chew 81 mg by mouth daily.  . Blood Glucose Monitoring Suppl (ONETOUCH VERIO) w/Device KIT Use to check FSBS BID. Dx: E11.59  . ferrous sulfate 324 (65 Fe) MG TBEC Take 1 tablet (325 mg total) by mouth daily.  Marland Kitchen glucose blood (ONETOUCH VERIO) test strip Use to check FSBS BID. Dx: E11.59.  Marland Kitchen Lancets (ONETOUCH ULTRASOFT) lancets Use to check FSBS BID. Dx: E11.59  . metFORMIN (GLUCOPHAGE) 500 MG tablet TAKE 2 TABLETS BY MOUTH WITH BREAKFAST AND TAKE 1 TABLET BY MOUTH WITH DINNER  . nitroGLYCERIN (NITROSTAT) 0.4 MG SL tablet Place 1 tablet (0.4 mg total) under the tongue every 5 (five) minutes as needed for chest pain.  . pantoprazole (PROTONIX) 40 MG tablet Take 1 tablet (40 mg total) by mouth daily. For stomach protection  . [DISCONTINUED] atorvastatin (LIPITOR) 80 MG tablet TOME UNA TABLETA TODOS LOS DIAS  . [DISCONTINUED] ezetimibe (ZETIA) 10 MG tablet Take 1 tablet (10 mg total) by mouth daily.  . [DISCONTINUED] isosorbide mononitrate (IMDUR) 120 MG 24 hr tablet TOME  UNA TABLETA TODOS LOS DIAS. DX: I10, I25.10  . [DISCONTINUED] lisinopril (ZESTRIL) 40 MG tablet TOME UNA TABLETA TODOS LOS DIAS  . [DISCONTINUED] metoprolol succinate (TOPROL-XL) 50 MG 24 hr tablet Take 1 tablet (50 mg total) by mouth daily.     Allergies:   Patient has no known allergies.   Social History   Tobacco Use  . Smoking status: Never Smoker  . Smokeless tobacco: Never Used  Substance Use Topics  . Alcohol use: Not Currently  . Drug use: Never     Family History: The patient's family history  includes Hypertension in her sister and sister. There is no history of Stroke.  ROS:   Please see the history of present illness.    (+) Chest pain All other systems reviewed and are negative.  EKGs/Labs/Other Studies Reviewed:    The following studies were reviewed today:  Myocardial Perfusion Imaging 03/07/2021:  The left ventricular ejection fraction is hyperdynamic (>65%).  Nuclear stress EF: 70%.  There was no ST segment deviation noted during stress.   No ischemia or infarction on perfusion images. Normal wall motion.   Below average exercise capacity may reduce the sensitivity of this study to detect ischemia. Intermediate risk study due to chest pain in recovery and low workload.   EKG:  04/11/2021: EKG is not ordered today. 02/21/2021: Normal sinus rhythm, rate 71  Recent Labs: 01/31/2021: ALT 24; BUN 15; Creatinine, Ser 0.72; Hemoglobin 10.8; Platelets 294; Potassium 4.3; Sodium 140  Recent Lipid Panel    Component Value Date/Time   CHOL 185 01/31/2021 1139   TRIG 183 (H) 01/31/2021 1139   HDL 45 01/31/2021 1139   CHOLHDL 4.1 01/31/2021 1139   CHOLHDL 7.6 Ratio 03/14/2009 2319   VLDL 32 03/14/2009 2319   LDLCALC 108 (H) 01/31/2021 1139   LDLDIRECT 171 (H) 10/29/2020 1458    Physical Exam:    VS:  BP 138/72   Pulse 78   Ht 5' 1"  (1.549 m)   Wt 180 lb 3.2 oz (81.7 kg)   SpO2 100%   BMI 34.05 kg/m     Wt Readings from Last 5 Encounters:  04/11/21 180 lb 3.2 oz (81.7 kg)  03/07/21 180 lb (81.6 kg)  02/21/21 180 lb 12.8 oz (82 kg)  01/31/21 175 lb (79.4 kg)  10/29/20 173 lb (78.5 kg)   Constitutional: No acute distress Eyes: sclera non-icteric, normal conjunctiva and lids ENMT: normal dentition, moist mucous membranes Cardiovascular: regular rhythm, normal rate, no murmurs. S1 and S2 normal. Radial pulses normal bilaterally. No jugular venous distention.  Respiratory: clear to auscultation bilaterally GI : normal bowel sounds, soft and nontender. No  distention.   MSK: extremities warm, well perfused. No edema.  NEURO: grossly nonfocal exam, moves all extremities. PSYCH: alert and oriented x 3, normal mood and affect.    ASSESSMENT:    1. Medication management   2. Mixed hyperlipidemia   3. Essential hypertension   4. CAD, multiple vessel, 2 stent    5. Controlled type 2 diabetes mellitus without complication, without long-term current use of insulin (HCC)    PLAN:    Essential hypertension-blood pressure is mildly elevated today but slightly improved from prior, I am up titrating her metoprolol again, we will reassess blood pressure at her next follow-up.  Mixed hyperlipidemia-continues on atorvastatin 80 mg daily, LDL 108.  Triglycerides 183.  She was recently started on Zetia, will recheck in approximately 3 months or can be performed by PCP.  Chest pain-improved with  increased dose of metoprolol, she feels 1 additional dose increase would help her even more, will increase metoprolol to 75 mg daily.  Precordial pain  CAD, multiple vessel, 2 stent  -Prior history of PCI now with evidence of stable angina.  She was not symptomatic at her last visit and now is describing chest pain with activity.  I think we need to restratify with a nuclear stress test.  Since this is exertional symptoms, would prefer treadmill stress test with a normal resting ECG.  If suboptimal exercise which she feels may happen, convert to [employer].  We will follow up right after stress test results are available.  If high risk, will consider coronary angiography.    Cherlynn Kaiser, MD, [facility]    Medication Adjustments/Labs and Tests Ordered: Current medicines are reviewed at length with the patient today.  Concerns regarding medicines are outlined above.   No orders of the defined types were placed in this encounter.   Meds ordered this encounter  Medications  . atorvastatin (LIPITOR) 80 MG tablet    Sig: TOME  UNA TABLETA TODOS LOS DIAS    Dispense:  90 tablet    Refill:  1  . ezetimibe (ZETIA) 10 MG tablet    Sig: Take 1 tablet (10 mg total) by mouth daily.    Dispense:  90 tablet    Refill:  3  . isosorbide mononitrate (IMDUR) 120 MG 24 hr tablet    Sig: Please Take 167m (1 Tablet) Daily    Dispense:  30 tablet    Refill:  3  . lisinopril (ZESTRIL) 40 MG tablet    Sig: TOME UNA TABLETA TODOS LOS DIAS    Dispense:  90 tablet    Refill:  1    DX Code Needed  .  . metoprolol succinate (TOPROL-XL) 50 MG 24 hr tablet    Sig: Take 1.5 tablets (75 mg total) by mouth daily.    Dispense:  30 tablet    Refill:  3    DOSE CHANGE. INCREASE TO 522mDAILY    Patient Instructions  Medication Instructions:  INCREASE METOPROLOL SUCCINATE TO 7533mAILY (1 and 1/2 Tablets) CARDIAC MEDICATIONS HAVE BEEN REFILLED  *If you need a refill on your cardiac medications before your next appointment, please call your pharmacy*  Follow-Up: [facility] and your [facility] needs are our priority.  As part of our continuing mission to provide you with exceptional heart care, we have created designated Provider Care Teams.  These Care Teams include your primary Cardiologist (physician) and Advanced Practice Providers (APPs -  Physician Assistants and Nurse Practitioners) who all work together to provide you with the care you need, when you need it.  We recommend signing up for the patient portal called "MyChart".  Sign up information is provided on this After Visit Summary.  MyChart is used to connect with patients for Virtual Visits (Telemedicine).  Patients are able to view lab/test results, encounter notes, upcoming appointments, etc.  Non-urgent messages can be sent to your provider as well.   To learn more about what you can do with MyChart, go to [email or web address]  Your next appointment:   3 month(s)  The format for your next appointment:   In Person  Provider:   GayCherlynn KaiserD      I,[facility],acting as a scribe for GayElouise MunroeD.,have documented all relevant documentation on the behalf of GayElouise MunroeD,as directed  by  Elouise Munroe, MD while in the presence of Elouise Munroe, MD.  I, Elouise Munroe, MD, have reviewed all documentation for this visit. The documentation on 04/11/21 for the exam, diagnosis, procedures, and orders are all accurate and complete.

## 2021-04-11 NOTE — Telephone Encounter (Signed)
Attempt to call patient to inform patient to request medication from Cardiology office. Patient voicemail box is not set up. Unable to leave message.  3rd attempt.  Please refill NTG and metoprolol if appropriate.

## 2021-05-20 ENCOUNTER — Other Ambulatory Visit: Payer: Self-pay | Admitting: Internal Medicine

## 2021-05-20 DIAGNOSIS — I10 Essential (primary) hypertension: Secondary | ICD-10-CM

## 2021-05-20 DIAGNOSIS — I2510 Atherosclerotic heart disease of native coronary artery without angina pectoris: Secondary | ICD-10-CM

## 2021-06-26 ENCOUNTER — Ambulatory Visit: Payer: Medicare Other | Admitting: Family

## 2021-07-04 ENCOUNTER — Ambulatory Visit (INDEPENDENT_AMBULATORY_CARE_PROVIDER_SITE_OTHER): Payer: Medicare Other | Admitting: Family Medicine

## 2021-07-04 ENCOUNTER — Encounter: Payer: Self-pay | Admitting: Family Medicine

## 2021-07-04 ENCOUNTER — Other Ambulatory Visit: Payer: Self-pay

## 2021-07-04 VITALS — BP 120/68 | HR 80 | Temp 98.1°F | Resp 15 | Ht 60.98 in | Wt 180.8 lb

## 2021-07-04 DIAGNOSIS — I10 Essential (primary) hypertension: Secondary | ICD-10-CM | POA: Diagnosis not present

## 2021-07-04 DIAGNOSIS — E119 Type 2 diabetes mellitus without complications: Secondary | ICD-10-CM

## 2021-07-04 DIAGNOSIS — E782 Mixed hyperlipidemia: Secondary | ICD-10-CM

## 2021-07-04 DIAGNOSIS — F341 Dysthymic disorder: Secondary | ICD-10-CM

## 2021-07-04 LAB — POCT GLYCOSYLATED HEMOGLOBIN (HGB A1C): Hemoglobin A1C: 8.9 % — AB (ref 4.0–5.6)

## 2021-07-04 LAB — GLUCOSE, POCT (MANUAL RESULT ENTRY): POC Glucose: 206 mg/dl — AB (ref 70–99)

## 2021-07-04 MED ORDER — METFORMIN HCL 1000 MG PO TABS: 1000.0000 mg | ORAL_TABLET | Freq: Two times a day (BID) | ORAL | 1 refills | Status: AC

## 2021-07-04 MED ORDER — GLIPIZIDE 5 MG PO TABS: 5.0000 mg | ORAL_TABLET | Freq: Every day | ORAL | 0 refills | Status: AC

## 2021-07-04 MED ORDER — ESCITALOPRAM OXALATE 5 MG PO TABS: 5.0000 mg | ORAL_TABLET | Freq: Every day | ORAL | 1 refills | Status: AC

## 2021-07-04 NOTE — Progress Notes (Signed)
Established Patient Office Visit  Subjective:  Patient ID: Erin Estes, female    DOB: 1946/10/09  Age: 75 y.o. MRN: [id number]  CC:  Chief Complaint  Patient presents with   Diabetes   Hyperlipidemia    HPI Erin Estes presents for follow-up of chronic medical issues including diabetes hypertension and hyperlipidemia.  History was aided by [place] interpreter.  Past Medical History:  Diagnosis Date   CAD (coronary artery disease)    Diabetes mellitus without complication ([place])    Hyperlipidemia    Hypertension     Past Surgical History:  Procedure Laterality Date   CORONARY ANGIOPLASTY N/A    HEMORRHOID SURGERY     KNEE SURGERY Left     Family History  Problem Relation Age of Onset   Hypertension Sister    Hypertension Sister    Stroke Neg Hx     Social History   Socioeconomic History   Marital status: Married    Spouse name: Not on file   Number of children: Not on file   Years of education: Not on file   Highest education level: Not on file  Occupational History   Not on file  Tobacco Use   Smoking status: Never   Smokeless tobacco: Never  Substance and Sexual Activity   Alcohol use: Not Currently   Drug use: Never   Sexual activity: Not on file  Other Topics Concern   Not on file  Social History Narrative   ** Merged History Encounter **       Social Determinants of Health   Financial Resource Strain: Not on file  Food Insecurity: Not on file  Transportation Needs: Not on file  Physical Activity: Not on file  Stress: Not on file  Social Connections: Not on file  Intimate Partner Violence: Not on file    Outpatient Medications Prior to Visit  Medication Sig Dispense Refill   aspirin 81 MG chewable tablet Chew 81 mg by mouth daily.     atorvastatin (LIPITOR) 80 MG tablet TOME UNA TABLETA TODOS LOS DIAS 90 tablet 1   Blood Glucose Monitoring Suppl (ONETOUCH VERIO) w/Device KIT Use to check FSBS BID. Dx: E11.59 1 kit 0   ezetimibe (ZETIA)  10 MG tablet Take 1 tablet (10 mg total) by mouth daily. 90 tablet 3   ferrous sulfate 324 (65 Fe) MG TBEC Take 1 tablet (325 mg total) by mouth daily. 30 tablet 5   glucose blood (ONETOUCH VERIO) test strip Use to check FSBS BID. Dx: E11.59. 100 each 1   isosorbide mononitrate (IMDUR) 120 MG 24 hr tablet Please Take 135m (1 Tablet) Daily 30 tablet 3   Lancets (ONETOUCH ULTRASOFT) lancets Use to check FSBS BID. Dx: E11.59 100 each 1   lisinopril (ZESTRIL) 40 MG tablet TOME UNA TABLETA TODOS LOS DIAS 90 tablet 1   metoprolol succinate (TOPROL-XL) 50 MG 24 hr tablet Take 1.5 tablets (75 mg total) by mouth daily. 30 tablet 3   nitroGLYCERIN (NITROSTAT) 0.4 MG SL tablet Place 1 tablet (0.4 mg total) under the tongue every 5 (five) minutes as needed for chest pain. 25 tablet 3   pantoprazole (PROTONIX) 40 MG tablet Take 1 tablet (40 mg total) by mouth daily. For stomach protection 30 tablet 11   metFORMIN (GLUCOPHAGE) 500 MG tablet TAKE 2 TABLETS BY MOUTH WITH BREAKFAST AND TAKE 1 TABLET BY MOUTH WITH DINNER 270 tablet 2   No facility-administered medications prior to visit.    No Known Allergies  ROS Review of Systems  Psychiatric/Behavioral:  Negative for self-injury, sleep disturbance and suicidal ideas.   All other systems reviewed and are negative.    Objective:    Physical Exam Vitals and nursing note reviewed.  Constitutional:      General: She is not in acute distress. Cardiovascular:     Rate and Rhythm: Normal rate and regular rhythm.  Pulmonary:     Effort: Pulmonary effort is normal.     Breath sounds: Normal breath sounds.  Abdominal:     Palpations: Abdomen is soft.     Tenderness: There is no abdominal tenderness.  Musculoskeletal:     Right lower leg: No edema.     Left lower leg: No edema.  Neurological:     General: No focal deficit present.     Mental Status: She is alert and oriented to person, place, and time.  Psychiatric:        Mood and Affect: Mood  normal.        Behavior: Behavior normal.    BP 120/68 (BP Location: Left Arm, Patient Position: Sitting, Cuff Size: Large)   Pulse 80   Temp 98.1 F (36.7 C)   Resp 15   Ht 5' 0.98" (1.549 m)   Wt 180 lb 12.8 oz (82 kg)   SpO2 95%   BMI 34.18 kg/m  Wt Readings from Last 3 Encounters:  07/04/21 180 lb 12.8 oz (82 kg)  04/11/21 180 lb 3.2 oz (81.7 kg)  03/07/21 180 lb (81.6 kg)     Health Maintenance Due  Topic Date Due   COVID-19 Vaccine (1) Never done   OPHTHALMOLOGY EXAM  Never done   Zoster Vaccines- Shingrix (1 of 2) Never done   COLONOSCOPY (Pts 45-31yr Insurance coverage will need to be confirmed)  Never done   PNA vac Low Risk Adult (2 of 2 - PPSV23) 07/03/2020   INFLUENZA VACCINE  06/23/2021    There are no preventive care reminders to display for this patient.        Lab Results  Component Value Date   HGBA1C 8.9 (A) 07/04/2021      Assessment & Plan:   1. Controlled type 2 diabetes mellitus without complication, without long-term current use of insulin (HCC) Increasing A1c.  Discussed compliance.  Will increase metformin to 1000 mg twice daily and add glipizide 5 mg in the a.m. with meals. - POCT glucose (manual entry) - POCT glycosylated hemoglobin (Hb A1C)  2. Essential hypertension Appears stable. continue  3. Dysthymia Lexapro 5 mg was prescribed. will monitor  4. Mixed hyperlipidemia Continue present management   Meds ordered this encounter  Medications   metFORMIN (GLUCOPHAGE) 1000 MG tablet    Sig: Take 1 tablet (1,000 mg total) by mouth 2 (two) times daily with a meal.    Dispense:  180 tablet    Refill:  1   glipiZIDE (GLUCOTROL) 5 MG tablet    Sig: Take 1 tablet (5 mg total) by mouth daily before breakfast.    Dispense:  90 tablet    Refill:  0   escitalopram (LEXAPRO) 5 MG tablet    Sig: Take 1 tablet (5 mg total) by mouth daily.    Dispense:  30 tablet    Refill:  1    Follow-up: Return in about 6 weeks (around  08/15/2021) for follow up.    WBecky Sax MD

## 2021-07-04 NOTE — Progress Notes (Signed)
Pt presents for diabetes and cholesterol follow-up using [employer] interpretor services pt reports that she needs lipids checked for cardiologists

## 2021-07-30 ENCOUNTER — Ambulatory Visit: Payer: Medicare Other | Admitting: Internal Medicine

## 2021-08-15 ENCOUNTER — Ambulatory Visit: Payer: Managed Care, Other (non HMO) | Admitting: Family Medicine

## 2022-01-19 ENCOUNTER — Other Ambulatory Visit: Payer: Self-pay | Admitting: *Deleted

## 2022-01-19 ENCOUNTER — Other Ambulatory Visit: Payer: Self-pay | Admitting: Internal Medicine

## 2022-01-19 DIAGNOSIS — E139 Other specified diabetes mellitus without complications: Secondary | ICD-10-CM

## 2022-02-01 ENCOUNTER — Other Ambulatory Visit: Payer: Self-pay | Admitting: Internal Medicine

## 2022-02-01 DIAGNOSIS — Z7982 Long term (current) use of aspirin: Secondary | ICD-10-CM

## 2022-02-01 DIAGNOSIS — D509 Iron deficiency anemia, unspecified: Secondary | ICD-10-CM

## 2022-09-10 IMAGING — MG DIGITAL SCREENING BILAT W/ CAD
5 series · 5 of 5 positions shown · non-contrast
Comparison: Previous exam(s).

CLINICAL DATA: Screening.

EXAM:
DIGITAL SCREENING BILATERAL MAMMOGRAM WITH CAD
TECHNIQUE: Bilateral screening digital craniocaudal and mediolateral oblique
mammograms were obtained. The images were evaluated with
computer-aided detection.

[R MLO]
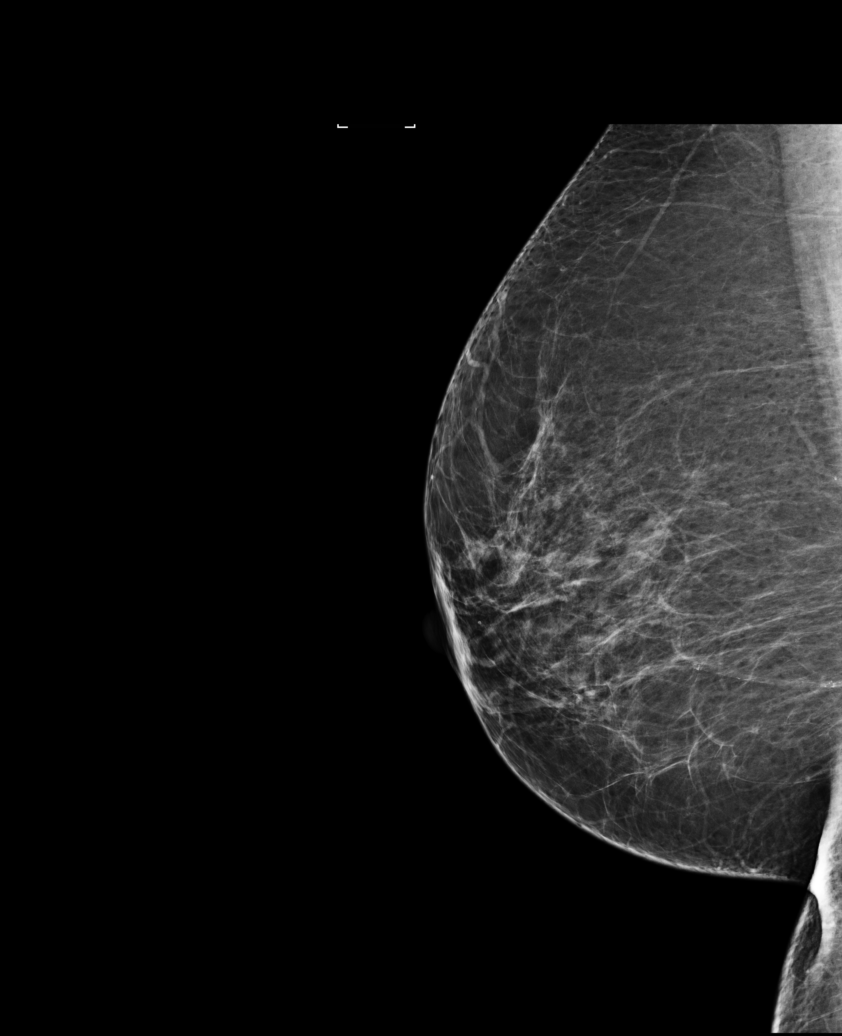

[R CC]
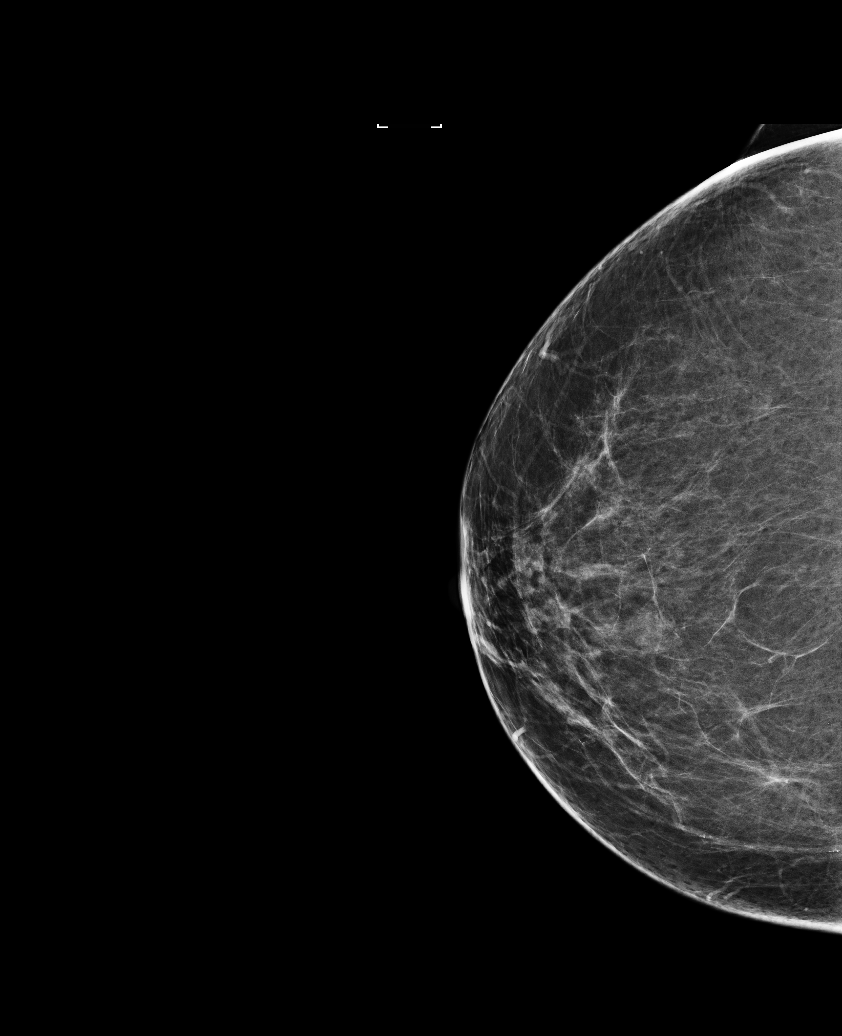

[L MLO (1 of 2)]
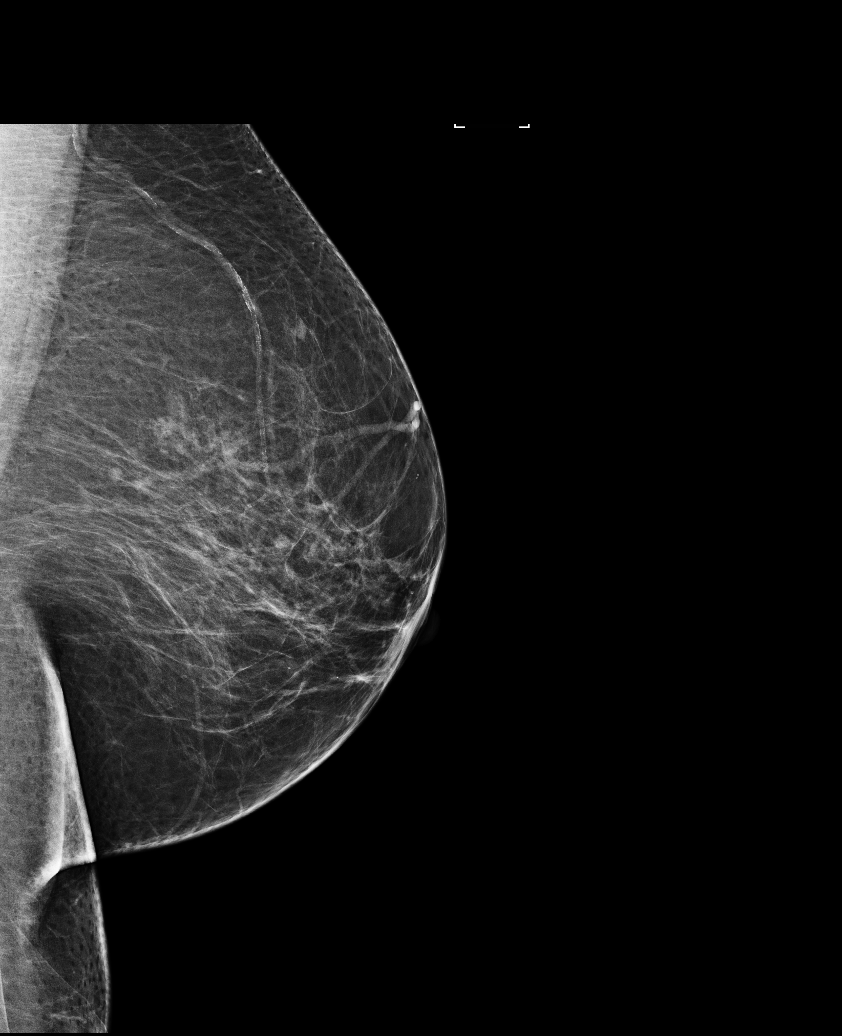

[L MLO (2 of 2)]
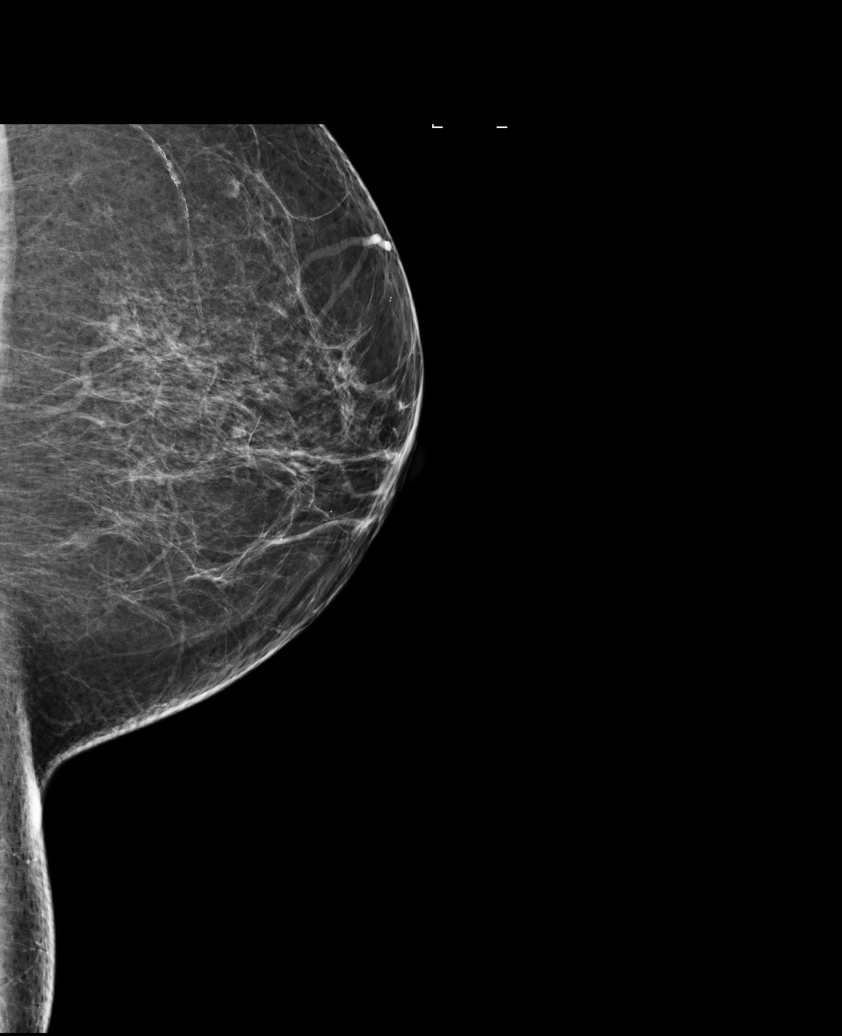

[L CC]
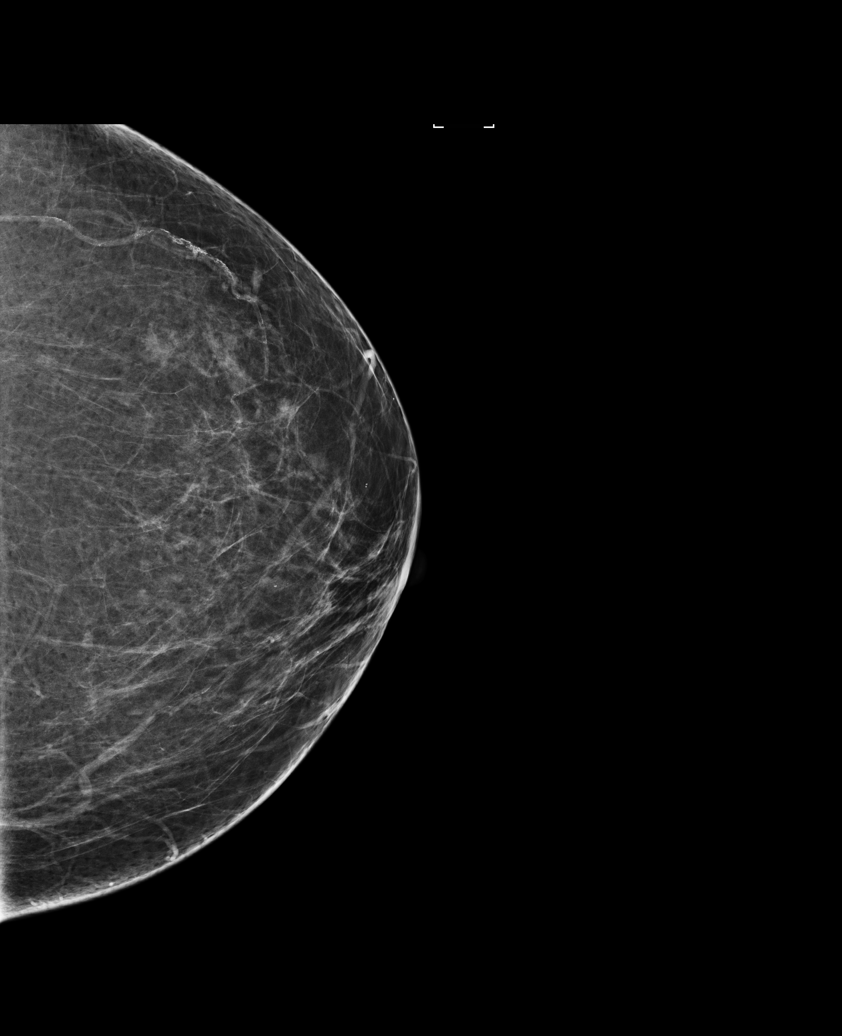

[5 of 5 positions shown; findings below may reference images not displayed]

ACR Breast Density Category b: There are scattered areas of
fibroglandular density.
FINDINGS: There are no findings suspicious for malignancy. The images were
evaluated with computer-aided detection.
IMPRESSION: No mammographic evidence of malignancy. A result letter of this
screening mammogram will be mailed directly to the patient.

RECOMMENDATION:
Screening mammogram in one year. (Code:[id number])

BI-RADS CATEGORY  1: Negative.

## 2022-10-09 DIAGNOSIS — Z6830 Body mass index (BMI) 30.0-30.9, adult: Secondary | ICD-10-CM | POA: Diagnosis not present

## 2022-10-09 DIAGNOSIS — I10 Essential (primary) hypertension: Secondary | ICD-10-CM | POA: Diagnosis not present

## 2022-10-09 DIAGNOSIS — Z8249 Family history of ischemic heart disease and other diseases of the circulatory system: Secondary | ICD-10-CM | POA: Diagnosis not present

## 2022-10-09 DIAGNOSIS — F330 Major depressive disorder, recurrent, mild: Secondary | ICD-10-CM | POA: Diagnosis not present

## 2022-10-09 DIAGNOSIS — I25119 Atherosclerotic heart disease of native coronary artery with unspecified angina pectoris: Secondary | ICD-10-CM | POA: Diagnosis not present

## 2022-10-09 DIAGNOSIS — E785 Hyperlipidemia, unspecified: Secondary | ICD-10-CM | POA: Diagnosis not present

## 2022-10-09 DIAGNOSIS — R69 Illness, unspecified: Secondary | ICD-10-CM | POA: Diagnosis not present

## 2022-10-09 DIAGNOSIS — E1159 Type 2 diabetes mellitus with other circulatory complications: Secondary | ICD-10-CM | POA: Diagnosis not present

## 2022-10-09 DIAGNOSIS — E669 Obesity, unspecified: Secondary | ICD-10-CM | POA: Diagnosis not present

## 2022-10-09 DIAGNOSIS — Z823 Family history of stroke: Secondary | ICD-10-CM | POA: Diagnosis not present

## 2022-10-09 DIAGNOSIS — Z7984 Long term (current) use of oral hypoglycemic drugs: Secondary | ICD-10-CM | POA: Diagnosis not present

## 2022-10-09 DIAGNOSIS — Z800 Family history of malignant neoplasm of digestive organs: Secondary | ICD-10-CM | POA: Diagnosis not present

## 2022-10-09 DIAGNOSIS — R32 Unspecified urinary incontinence: Secondary | ICD-10-CM | POA: Diagnosis not present

## 2022-11-18 ENCOUNTER — Telehealth: Payer: Self-pay | Admitting: Family Medicine

## 2022-11-18 NOTE — Telephone Encounter (Signed)
Tried calling patient to schedule Medicare Annual Wellness Visit (AWV) either virtually or phone   my jabber number [phone]   No answer  * due 05/23/14 awvi per [facility]     45 min for awv-i and in office appointments 30 min for awv-s  phone/virtual appointments

## 2022-12-15 DIAGNOSIS — I2510 Atherosclerotic heart disease of native coronary artery without angina pectoris: Secondary | ICD-10-CM | POA: Diagnosis not present

## 2022-12-15 DIAGNOSIS — Z13820 Encounter for screening for osteoporosis: Secondary | ICD-10-CM | POA: Diagnosis not present

## 2022-12-15 DIAGNOSIS — Z79899 Other long term (current) drug therapy: Secondary | ICD-10-CM | POA: Diagnosis not present

## 2022-12-15 DIAGNOSIS — R072 Precordial pain: Secondary | ICD-10-CM | POA: Diagnosis not present

## 2022-12-15 DIAGNOSIS — Z113 Encounter for screening for infections with a predominantly sexual mode of transmission: Secondary | ICD-10-CM | POA: Diagnosis not present

## 2022-12-15 DIAGNOSIS — E782 Mixed hyperlipidemia: Secondary | ICD-10-CM | POA: Diagnosis not present

## 2022-12-15 DIAGNOSIS — N1831 Chronic kidney disease, stage 3a: Secondary | ICD-10-CM | POA: Diagnosis not present

## 2022-12-15 DIAGNOSIS — R69 Illness, unspecified: Secondary | ICD-10-CM | POA: Diagnosis not present

## 2022-12-15 DIAGNOSIS — E559 Vitamin D deficiency, unspecified: Secondary | ICD-10-CM | POA: Diagnosis not present

## 2022-12-15 DIAGNOSIS — I10 Essential (primary) hypertension: Secondary | ICD-10-CM | POA: Diagnosis not present

## 2022-12-15 DIAGNOSIS — Z23 Encounter for immunization: Secondary | ICD-10-CM | POA: Diagnosis not present

## 2022-12-15 DIAGNOSIS — R42 Dizziness and giddiness: Secondary | ICD-10-CM | POA: Diagnosis not present

## 2022-12-15 DIAGNOSIS — I129 Hypertensive chronic kidney disease with stage 1 through stage 4 chronic kidney disease, or unspecified chronic kidney disease: Secondary | ICD-10-CM | POA: Diagnosis not present

## 2022-12-15 DIAGNOSIS — I951 Orthostatic hypotension: Secondary | ICD-10-CM | POA: Diagnosis not present

## 2022-12-15 DIAGNOSIS — E119 Type 2 diabetes mellitus without complications: Secondary | ICD-10-CM | POA: Diagnosis not present

## 2022-12-15 DIAGNOSIS — Z1159 Encounter for screening for other viral diseases: Secondary | ICD-10-CM | POA: Diagnosis not present

## 2022-12-15 DIAGNOSIS — Z0000 Encounter for general adult medical examination without abnormal findings: Secondary | ICD-10-CM | POA: Diagnosis not present

## 2023-01-14 DIAGNOSIS — Z1231 Encounter for screening mammogram for malignant neoplasm of breast: Secondary | ICD-10-CM | POA: Diagnosis not present

## 2023-02-19 DIAGNOSIS — Z13820 Encounter for screening for osteoporosis: Secondary | ICD-10-CM | POA: Diagnosis not present

## 2023-02-19 DIAGNOSIS — Z780 Asymptomatic menopausal state: Secondary | ICD-10-CM | POA: Diagnosis not present

## 2023-07-28 ENCOUNTER — Ambulatory Visit: Payer: Medicare HMO | Admitting: Family Medicine

## 2024-05-28 ENCOUNTER — Other Ambulatory Visit: Payer: Self-pay

## 2024-05-28 ENCOUNTER — Encounter (HOSPITAL_COMMUNITY): Payer: Self-pay

## 2024-05-28 ENCOUNTER — Emergency Department (HOSPITAL_COMMUNITY)
Admission: EM | Admit: 2024-05-28 | Discharge: 2024-05-28 | Disposition: A | Discharge status: Home / Self Care | Attending: Emergency Medicine | Admitting: Emergency Medicine

## 2024-05-28 DIAGNOSIS — I10 Essential (primary) hypertension: Secondary | ICD-10-CM | POA: Insufficient documentation

## 2024-05-28 DIAGNOSIS — L02214 Cutaneous abscess of groin: Secondary | ICD-10-CM | POA: Insufficient documentation

## 2024-05-28 DIAGNOSIS — Z7984 Long term (current) use of oral hypoglycemic drugs: Secondary | ICD-10-CM | POA: Insufficient documentation

## 2024-05-28 DIAGNOSIS — Z79899 Other long term (current) drug therapy: Secondary | ICD-10-CM | POA: Insufficient documentation

## 2024-05-28 DIAGNOSIS — E119 Type 2 diabetes mellitus without complications: Secondary | ICD-10-CM | POA: Insufficient documentation

## 2024-05-28 DIAGNOSIS — R1030 Lower abdominal pain, unspecified: Secondary | ICD-10-CM | POA: Diagnosis present

## 2024-05-28 DIAGNOSIS — Z7982 Long term (current) use of aspirin: Secondary | ICD-10-CM | POA: Insufficient documentation

## 2024-05-28 LAB — CBC WITH DIFFERENTIAL/PLATELET
Abs Immature Granulocytes: 0.05 K/uL (ref 0.00–0.07)
Basophils Absolute: 0 K/uL (ref 0.0–0.1)
Basophils Relative: 0 %
Eosinophils Absolute: 0.1 K/uL (ref 0.0–0.5)
Eosinophils Relative: 1 %
HCT: 34.5 % — ABNORMAL LOW (ref 36.0–46.0)
Hemoglobin: 10.6 g/dL — ABNORMAL LOW (ref 12.0–15.0)
Immature Granulocytes: 0 %
Lymphocytes Relative: 16 %
Lymphs Abs: 2.1 K/uL (ref 0.7–4.0)
MCH: 22.1 pg — ABNORMAL LOW (ref 26.0–34.0)
MCHC: 30.7 g/dL (ref 30.0–36.0)
MCV: 71.9 fL — ABNORMAL LOW (ref 80.0–100.0)
Monocytes Absolute: 1 K/uL (ref 0.1–1.0)
Monocytes Relative: 8 %
Neutro Abs: 9.9 K/uL — ABNORMAL HIGH (ref 1.7–7.7)
Neutrophils Relative %: 75 %
Platelets: 232 K/uL (ref 150–400)
RBC: 4.8 MIL/uL (ref 3.87–5.11)
RDW: 14.3 % (ref 11.5–15.5)
WBC: 13.1 K/uL — ABNORMAL HIGH (ref 4.0–10.5)
nRBC: 0 % (ref 0.0–0.2)

## 2024-05-28 LAB — COMPREHENSIVE METABOLIC PANEL WITH GFR
ALT: 25 U/L (ref 0–44)
AST: 22 U/L (ref 15–41)
Albumin: 3.4 g/dL — ABNORMAL LOW (ref 3.5–5.0)
Alkaline Phosphatase: 65 U/L (ref 38–126)
Anion gap: 13 (ref 5–15)
BUN: 13 mg/dL (ref 8–23)
CO2: 25 mmol/L (ref 22–32)
Calcium: 9 mg/dL (ref 8.9–10.3)
Chloride: 99 mmol/L (ref 98–111)
Creatinine, Ser: 0.95 mg/dL (ref 0.44–1.00)
GFR, Estimated: >60 mL/min (ref >60)
Glucose, Bld: 171 mg/dL — ABNORMAL HIGH (ref 70–99)
Potassium: 4.1 mmol/L (ref 3.5–5.1)
Sodium: 137 mmol/L (ref 135–145)
Total Bilirubin: 0.8 mg/dL (ref 0.0–1.2)
Total Protein: 7 g/dL (ref 6.5–8.1)

## 2024-05-28 LAB — URINALYSIS, W/ REFLEX TO CULTURE (INFECTION SUSPECTED)
Bilirubin Urine: NEGATIVE
Glucose, UA: NEGATIVE mg/dL
Hgb urine dipstick: NEGATIVE
Ketones, ur: NEGATIVE mg/dL
Leukocytes,Ua: NEGATIVE
Nitrite: NEGATIVE
Protein, ur: NEGATIVE mg/dL
Specific Gravity, Urine: 1.011 (ref 1.005–1.030)
pH: 7 (ref 5.0–8.0)

## 2024-05-28 LAB — I-STAT CG4 LACTIC ACID, ED: Lactic Acid, Venous: 1.2 mmol/L (ref 0.5–1.9)

## 2024-05-28 MED ORDER — LIDOCAINE HCL (PF) 1 % IJ SOLN
10.0000 mL | Freq: Once | INTRAMUSCULAR | Status: AC
  Administered 2024-05-28: 10 mL
  Filled 2024-05-28: qty 10

## 2024-05-28 MED ORDER — DOXYCYCLINE HYCLATE 100 MG PO CAPS: 100.0000 mg | ORAL_CAPSULE | Freq: Two times a day (BID) | ORAL | 0 refills | Status: AC

## 2024-05-28 NOTE — ED Notes (Signed)
 Patient Alert and oriented to baseline. Stable and ambulatory to baseline. Patient verbalized understanding of the discharge instructions.  Patient belongings were taken by the patient.

## 2024-05-28 NOTE — ED Provider Notes (Signed)
  EMERGENCY DEPARTMENT AT [facility] Provider Note   CSN: [id number] Arrival date & time: 05/28/24  9264     Patient presents with: Abscess   Erin Estes is a 78 y.o. female with past medical history significant for hypertension, diabetes, hyperlipidemia who presents with concern for lump in right side of groin with redness, pain.  She reports that it is hard and painful.  No previous history of similar.    Abscess      Prior to Admission medications   Medication Sig Start Date End Date Taking? Authorizing Provider  doxycycline  (VIBRAMYCIN ) 100 MG capsule Take 1 capsule (100 mg total) by mouth 2 (two) times daily. 05/28/24  Yes Letetia Romanello H, PA-C  aspirin 81 MG chewable tablet Chew 81 mg by mouth daily.    [provider]  atorvastatin  (LIPITOR) 80 MG tablet TOME UNA TABLETA TODOS LOS DIAS 04/11/21   Acharya, Gayatri A, MD  Blood Glucose Monitoring Suppl (ONETOUCH VERIO) w/Device KIT Use to check FSBS BID. Dx: E11.59 07/20/19   Vicci Barnie NOVAK, MD  escitalopram  (LEXAPRO ) 5 MG tablet Take 1 tablet (5 mg total) by mouth daily. 07/04/21   Tanda Bleacher, MD  ezetimibe  (ZETIA ) 10 MG tablet Take 1 tablet (10 mg total) by mouth daily. 04/11/21   Acharya, Gayatri A, MD  ferrous sulfate  324 (65 Fe) MG TBEC Take 1 tablet (325 mg total) by mouth daily. 04/09/21   Prentiss Dorothyann Maxwell, MD  glipiZIDE  (GLUCOTROL ) 5 MG tablet Take 1 tablet (5 mg total) by mouth daily before breakfast. 07/04/21   Tanda Bleacher, MD  glucose blood (ONETOUCH VERIO) test strip Use to check FSBS BID. Dx: E11.59. 07/20/19   Vicci Barnie NOVAK, MD  isosorbide  mononitrate (IMDUR ) 120 MG 24 hr tablet Please Take 120mg  (1 Tablet) Daily 04/11/21   Acharya, Gayatri A, MD  JARDIANCE 10 MG TABS tablet TOME UNA TABLETA TODOS LOS DIAS FOR 30 DAYS 01/19/22   Tanda Bleacher, MD  JARDIANCE 10 MG TABS tablet Take 10 mg by mouth daily. 12/23/21   [provider]  Lancets (ONETOUCH ULTRASOFT)  lancets Use to check FSBS BID. Dx: E11.59 07/20/19   Vicci Barnie NOVAK, MD  lisinopril  (ZESTRIL ) 40 MG tablet TOME UNA TABLETA TODOS LOS DIAS 04/11/21   Acharya, Gayatri A, MD  metFORMIN  (GLUCOPHAGE ) 1000 MG tablet Take 1 tablet (1,000 mg total) by mouth 2 (two) times daily with a meal. 07/04/21   Tanda Bleacher, MD  metoprolol  succinate (TOPROL -XL) 50 MG 24 hr tablet Take 1.5 tablets (75 mg total) by mouth daily. 04/11/21   Acharya, Gayatri A, MD  nitroGLYCERIN  (NITROSTAT ) 0.4 MG SL tablet Place 1 tablet (0.4 mg total) under the tongue every 5 (five) minutes as needed for chest pain. 04/11/21 07/10/21  Acharya, Gayatri A, MD  pantoprazole  (PROTONIX ) 40 MG tablet TOME UNA TABLETA TODOS LOS DIAS 02/02/22   Tanda Bleacher, MD    Allergies: Patient has no known allergies.    Review of Systems  All other systems reviewed and are negative.   Updated Vital Signs BP 120/63 (BP Location: Right Arm)   Pulse 71   Temp 98.2 F (36.8 C) (Oral)   Resp 18   Ht 5' 4 (1.626 m)   Wt 82 kg   SpO2 96%   BMI 31.03 kg/m   Physical Exam Vitals and nursing note reviewed.  Constitutional:      General: She is not in acute distress.    Appearance: Normal appearance.  HENT:  Head: Normocephalic and atraumatic.  Eyes:     General:        Right eye: No discharge.        Left eye: No discharge.  Cardiovascular:     Rate and Rhythm: Normal rate and regular rhythm.     Heart sounds: No murmur heard.    No friction rub. No gallop.  Pulmonary:     Effort: Pulmonary effort is normal.     Breath sounds: Normal breath sounds.  Abdominal:     General: Bowel sounds are normal.     Palpations: Abdomen is soft.  Skin:    General: Skin is warm and dry.     Capillary Refill: Capillary refill takes less than 2 seconds.     Comments: Small abscess noted in right inguinal crease.  Neurological:     Mental Status: She is alert and oriented to person, place, and time.  Psychiatric:        Mood and Affect: Mood  normal.        Behavior: Behavior normal.     (all labs ordered are listed, but only abnormal results are displayed) Labs Reviewed  COMPREHENSIVE METABOLIC PANEL WITH GFR - Abnormal; Notable for the following components:      Result Value   Glucose, Bld 171 (*)    Albumin 3.4 (*)    All other components within normal limits  CBC WITH DIFFERENTIAL/PLATELET - Abnormal; Notable for the following components:   WBC 13.1 (*)    Hemoglobin 10.6 (*)    HCT 34.5 (*)    MCV 71.9 (*)    MCH 22.1 (*)    Neutro Abs 9.9 (*)    All other components within normal limits  URINALYSIS, W/ REFLEX TO CULTURE (INFECTION SUSPECTED) - Abnormal; Notable for the following components:   Bacteria, UA RARE (*)    All other components within normal limits  I-STAT CG4 LACTIC ACID, ED    EKG: None  Radiology: No results found.   .Incision and Drainage  Date/Time: 05/28/2024 8:56 AM  Performed by: Rosan Sherlean DEL, PA-C Authorized by: Rosan Sherlean DEL, PA-C   Consent:    Consent obtained:  Verbal   Consent given by:  Patient   Risks, benefits, and alternatives were discussed: yes     Risks discussed:  Bleeding   Alternatives discussed:  No treatment Universal protocol:    Procedure explained and questions answered to patient or proxy's satisfaction: yes     Patient identity confirmed:  Verbally with patient Location:    Type:  Abscess   Size:  3cm   Location:  Anogenital   Anogenital location:  Vulva Pre-procedure details:    Skin preparation:  Povidone-iodine Sedation:    Sedation type:  None Anesthesia:    Anesthesia method:  Local infiltration   Local anesthetic:  Lidocaine  1% w/o epi Procedure type:    Complexity:  Simple Procedure details:    Incision types:  Single straight   Incision depth:  Dermal   Wound management:  Probed and deloculated   Drainage:  Bloody and purulent   Drainage amount:  Scant   Wound treatment:  Wound left open   Packing materials:   None Post-procedure details:    Procedure completion:  Tolerated    Medications Ordered in the ED  lidocaine  (PF) (XYLOCAINE ) 1 % injection 10 mL (10 mLs Infiltration Given by Other 05/28/24 9173)  Medical Decision Making Amount and/or Complexity of Data Reviewed Labs: ordered.  Risk Prescription drug management.   This patient is a 78 y.o. female  who presents to the ED for concern of groin abscess.   Differential diagnoses prior to evaluation: The emergent differential diagnosis includes, but is not limited to, Bartholin gland abscess, ingrown hair, penetrating abscess, cellulitis, versus other. This is not an exhaustive differential.   Past Medical History / Co-morbidities / Social History: Diabetes, hypertension, otherwise noncontributory  Physical Exam: Physical exam performed. The pertinent findings include: Vital signs stable in the emergency department, small indurated abscess associated with hair follicle noted in right inguinal crease.  Not actively draining at time my evaluation.  Some surrounding skin redness and irritation.  Lab Tests/Imaging studies: I personally interpreted labs/imaging and the pertinent results include: CBC with mildly elevated white blood cells at 13.1, mild anemia, hemoglobin 10.6, CMP overall unremarkable other than mild hyperglycemia, glucose 171.  Normal lactic acid, unremarkable UA.  Medications: Lidocaine  used to numb the affected site and abscess drained as described above.  Disposition: After consideration of the diagnostic results and the patients response to treatment, I feel that patient is stable for discharge with plan as above .   emergency department workup does not suggest an emergent condition requiring admission or immediate intervention beyond what has been performed at this time. The plan is: as above. The patient is safe for discharge and has been instructed to return immediately for  worsening symptoms, change in symptoms or any other concerns.   Final diagnoses:  Groin abscess    ED Discharge Orders          Ordered    doxycycline  (VIBRAMYCIN ) 100 MG capsule  2 times daily        05/28/24 0848               Elias Dennington, Sherlean DEL, PA-C 05/28/24 9096    Randol Simmonds, MD 05/29/24 2217

## 2024-05-28 NOTE — ED Triage Notes (Signed)
 Spanish translator used for triage.  Pt states she has what feels like a ball in her groin. Pt states she is unable to see it, but it feels warm and is raised on right side.

## 2024-05-28 NOTE — Discharge Instructions (Addendum)
 Mantenga la zona limpia y [place], y tome todos los antibiticos recetados. Si le preocupa la cicatrizacin, consulte con su mdico de cabecera para una evaluacin adicional. La inflamacin puede tardar un tiempo en desaparecer, incluso despus de que el dolor mejore.  Please keep the area clean and dry and take the entire course of antibiotics that we prescribed. Follow up with your PCP for further evaluation if you are concerned with the way it is healing. The swelling may take some time to resolve even after the pain improves.
